# Patient Record
Sex: Female | Born: 1980 | Hispanic: Yes | Marital: Married | Smoking: Never smoker
Health system: Southern US, Community
[De-identification: ages and names within clinical notes are randomized; demographics above are authoritative.]

## PROBLEM LIST (undated history)

## (undated) DIAGNOSIS — Z6841 Body Mass Index (BMI) 40.0 and over, adult: Principal | ICD-10-CM

## (undated) DIAGNOSIS — N911 Secondary amenorrhea: Secondary | ICD-10-CM

## (undated) HISTORY — DX: Body Mass Index (BMI) 40.0 and over, adult: Z684

## (undated) HISTORY — DX: Morbid (severe) obesity due to excess calories: E66.01

## (undated) HISTORY — DX: Secondary amenorrhea: N91.1

---

## 2013-01-31 ENCOUNTER — Encounter (HOSPITAL_COMMUNITY): Payer: Self-pay

## 2013-03-08 ENCOUNTER — Inpatient Hospital Stay (HOSPITAL_COMMUNITY): Admission: AD | Admit: 2013-03-08 | Payer: Self-pay | Source: Ambulatory Visit | Admitting: Obstetrics & Gynecology

## 2013-03-29 ENCOUNTER — Other Ambulatory Visit (HOSPITAL_COMMUNITY): Payer: Self-pay | Admitting: Physician Assistant

## 2013-03-29 DIAGNOSIS — Z3689 Encounter for other specified antenatal screening: Secondary | ICD-10-CM

## 2013-04-02 ENCOUNTER — Ambulatory Visit (HOSPITAL_COMMUNITY)
Admission: RE | Admit: 2013-04-02 | Discharge: 2013-04-02 | Disposition: A | Discharge status: Home / Self Care | Payer: Self-pay | Source: Ambulatory Visit | Attending: Physician Assistant | Admitting: Physician Assistant

## 2013-04-02 DIAGNOSIS — Z3689 Encounter for other specified antenatal screening: Secondary | ICD-10-CM | POA: Insufficient documentation

## 2013-04-09 ENCOUNTER — Other Ambulatory Visit (HOSPITAL_COMMUNITY): Payer: Self-pay | Admitting: Physician Assistant

## 2013-04-09 DIAGNOSIS — Z0489 Encounter for examination and observation for other specified reasons: Secondary | ICD-10-CM

## 2013-04-16 ENCOUNTER — Ambulatory Visit (HOSPITAL_COMMUNITY)
Admission: RE | Admit: 2013-04-16 | Discharge: 2013-04-16 | Disposition: A | Discharge status: Home / Self Care | Payer: Self-pay | Source: Ambulatory Visit | Attending: Physician Assistant | Admitting: Physician Assistant

## 2013-04-16 DIAGNOSIS — Z3689 Encounter for other specified antenatal screening: Secondary | ICD-10-CM | POA: Insufficient documentation

## 2013-04-16 DIAGNOSIS — O99210 Obesity complicating pregnancy, unspecified trimester: Secondary | ICD-10-CM | POA: Insufficient documentation

## 2013-04-16 DIAGNOSIS — E669 Obesity, unspecified: Secondary | ICD-10-CM | POA: Insufficient documentation

## 2013-04-16 DIAGNOSIS — Z0489 Encounter for examination and observation for other specified reasons: Secondary | ICD-10-CM

## 2015-08-06 ENCOUNTER — Ambulatory Visit: Payer: Self-pay

## 2015-08-13 ENCOUNTER — Encounter (HOSPITAL_COMMUNITY): Payer: Self-pay | Admitting: *Deleted

## 2015-08-13 ENCOUNTER — Emergency Department (INDEPENDENT_AMBULATORY_CARE_PROVIDER_SITE_OTHER)
Admission: EM | Admit: 2015-08-13 | Discharge: 2015-08-13 | Disposition: A | Discharge status: Home / Self Care | Payer: Self-pay | Source: Home / Self Care | Attending: Family Medicine | Admitting: Family Medicine

## 2015-08-13 DIAGNOSIS — J069 Acute upper respiratory infection, unspecified: Secondary | ICD-10-CM

## 2015-08-13 MED ORDER — CETIRIZINE HCL 10 MG PO TABS: 10.0000 mg | ORAL_TABLET | Freq: Every day | ORAL | Status: DC

## 2015-08-13 MED ORDER — IPRATROPIUM BROMIDE 0.06 % NA SOLN: 2.0000 | Freq: Four times a day (QID) | NASAL | Status: DC

## 2015-08-13 NOTE — ED Notes (Signed)
Pt  Has  A   sorethroat  X  5  Days        Pt      Is  Sitting  Upright  On  The    exma  Table    Also  Has  Symptoms  Of  Cough  As   Well

## 2015-08-13 NOTE — ED Provider Notes (Signed)
CSN: [id number]     Arrival date & time 08/13/15  1312 History   First MD Initiated Contact with Patient 08/13/15 1415     No chief complaint on file.  (Consider location/radiation/quality/duration/timing/severity/associated sxs/prior Treatment) Patient is a [age] y.o. female presenting with pharyngitis. The history is provided by the patient. The history is limited by a language barrier.  Sore Throat This is a new problem. The current episode started 2 days ago. The problem has not changed since onset.Pertinent negatives include no chest pain and no abdominal pain. The symptoms are aggravated by swallowing.    No past medical history on file. No past surgical history on file. No family history on file. Social History  Substance Use Topics  . Smoking status: Not on file  . Smokeless tobacco: Not on file  . Alcohol Use: Not on file   OB History    No data available     Review of Systems  Constitutional: Negative.   HENT: Positive for congestion, postnasal drip, rhinorrhea and sore throat.   Respiratory: Negative.   Cardiovascular: Negative.  Negative for chest pain.  Gastrointestinal: Negative.  Negative for abdominal pain.  All other systems reviewed and are negative.   Allergies  Review of patient's allergies indicates not on file.  Home Medications   Prior to Admission medications   Medication Sig Start Date End Date Taking? Authorizing Provider  cetirizine (ZYRTEC) 10 MG tablet Take 1 tablet (10 mg total) by mouth daily. 08/13/15   Linna HoffJames D Loda Bialas, MD  ipratropium (ATROVENT) 0.06 % nasal spray Place 2 sprays into both nostrils 4 (four) times daily. 08/13/15   Linna HoffJames D Pryor Guettler, MD   Meds Ordered and Administered this Visit  Medications - No data to display  BP 162/125 mmHg  Pulse 72  Temp(Src) 98.9 F (37.2 C) (Oral)  Resp 18  SpO2 95% No data found.   Physical Exam  Constitutional: She is oriented to person, place, and time. She appears well-developed and  well-nourished. No distress.  HENT:  Head: Normocephalic.  Right Ear: External ear normal.  Left Ear: External ear normal.  Nose: Rhinorrhea present.  Mouth/Throat: Oropharynx is clear and moist.  Neck: Normal range of motion. Neck supple.  Lymphadenopathy:    She has no cervical adenopathy.  Neurological: She is alert and oriented to person, place, and time.  Skin: Skin is warm and dry.  Nursing note and vitals reviewed.   ED Course  Procedures (including critical care time)  Labs Review Labs Reviewed - No data to display  Imaging Review No results found.   Visual Acuity Review  Right Eye Distance:   Left Eye Distance:   Bilateral Distance:    Right Eye Near:   Left Eye Near:    Bilateral Near:         MDM   1. URI (upper respiratory infection)    rx atrovent, zyrtec    Linna HoffJames D Sheniya Garciaperez, MD 08/13/15 1436

## 2015-08-27 ENCOUNTER — Ambulatory Visit: Payer: Self-pay | Admitting: Internal Medicine

## 2015-09-23 ENCOUNTER — Encounter (HOSPITAL_COMMUNITY): Payer: Self-pay | Admitting: *Deleted

## 2015-09-23 ENCOUNTER — Emergency Department (INDEPENDENT_AMBULATORY_CARE_PROVIDER_SITE_OTHER)
Admission: EM | Admit: 2015-09-23 | Discharge: 2015-09-23 | Disposition: A | Discharge status: Home / Self Care | Payer: Self-pay | Source: Home / Self Care

## 2015-09-23 ENCOUNTER — Emergency Department (INDEPENDENT_AMBULATORY_CARE_PROVIDER_SITE_OTHER): Payer: Self-pay

## 2015-09-23 DIAGNOSIS — T1490 Injury, unspecified: Secondary | ICD-10-CM

## 2015-09-23 DIAGNOSIS — T1490XA Injury, unspecified, initial encounter: Secondary | ICD-10-CM

## 2015-09-23 NOTE — ED Provider Notes (Addendum)
CSN: [id number]     Arrival date & time 09/23/15  1304 History   None    No chief complaint on file.  (Consider location/radiation/quality/duration/timing/severity/associated sxs/prior Treatment) The history is provided by the patient. A language interpreter was used.  History obtained from patient:   LOCATION: chest SEVERITY:3 DURATION:last night CONTEXT:exposed to cleaning chemicals QUALITY: MODIFYING FACTORS:went into the fresh air, only exposed about 5 minutes ASSOCIATED SYMPTOMS: nose felt it was burning TIMING:resolving     No past medical history on file. No past surgical history on file. No family history on file. Social History  Substance Use Topics  . Smoking status: Never Smoker   . Smokeless tobacco: Not on file  . Alcohol Use: No   OB History    No data available     Review of Systems ROS +'ve chemical exposure  Denies: HEADACHE, NAUSEA, ABDOMINAL PAIN, CHEST PAIN, CONGESTION, DYSURIA, SHORTNESS OF BREATH  Allergies  Review of patient's allergies indicates no known allergies.  Home Medications   Prior to Admission medications   Medication Sig Start Date End Date Taking? Authorizing Provider  cetirizine (ZYRTEC) 10 MG tablet Take 1 tablet (10 mg total) by mouth daily. 08/13/15   Linna HoffJames D Kindl, MD  ipratropium (ATROVENT) 0.06 % nasal spray Place 2 sprays into both nostrils 4 (four) times daily. 08/13/15   Linna HoffJames D Kindl, MD   Meds Ordered and Administered this Visit  Medications - No data to display  BP 149/97 mmHg  Pulse 110  Temp(Src) 99 F (37.2 C) (Oral)  Resp 20  SpO2 98% No data found.   Physical Exam  Constitutional: She is oriented to person, place, and time. She appears well-developed and well-nourished. No distress.  HENT:  Head: Normocephalic and atraumatic.  Eyes: Conjunctivae are normal.  Neck: Normal range of motion. Neck supple.  Cardiovascular: Normal rate, regular rhythm and normal heart sounds.   Pulmonary/Chest: Effort  normal and breath sounds normal.  Abdominal: Soft.  Musculoskeletal: Normal range of motion.  Neurological: She is alert and oriented to person, place, and time.  Skin: Skin is warm and dry.  Psychiatric: She has a normal mood and affect. Her behavior is normal. Judgment and thought content normal.    ED Course  Procedures (including critical care time)  Labs Review Labs Reviewed - No data to display  Imaging Review Dg Chest 2 View  09/23/2015  CLINICAL DATA:  Cough.  Shortness breath.  Headache for 2 days. EXAM: CHEST - 2 VIEW COMPARISON:  None. FINDINGS: Heart size is normal. The lung volumes are low. No focal airspace disease is present. There is no edema or effusion to suggest failure. The visualized soft tissues and bony thorax are unremarkable. IMPRESSION: 1. Low lung volumes. 2. No acute cardiopulmonary disease. Electronically Signed   By: Marin Robertshristopher  Mattern M.D.   On: 09/23/2015 14:31     Visual Acuity Review  Right Eye Distance:   Left Eye Distance:   Bilateral Distance:    Right Eye Near:   Left Eye Near:    Bilateral Near:       EKG: NSR with normal rate, rhythm, axis and intervals. No acute changes noted.   MDM   1. Inhalation injury     Chest x-ray does not demonstrate any acute pneumonitis. Patient is not dyspneic with exertion. I have reviewed EKG and chest x-ray results with the patient. She is advised that she can safely return to work his lungs is no further chemical exposure. Instructions or  care provided discharged home in stable condition.      Tharon Aquas, PA 09/23/15 1548  Tharon Aquas, [place] 10/19/15 [phone]

## 2015-09-23 NOTE — ED Notes (Signed)
Pt  Reports  Some  Chest pain/  Tightness    When  She  She  Takes  A  Deep  Breath      She reports  She  May  Have    Inhaled  Some  Cleaning  Solution  Last   Pm        She  Seems  Slightly  Anxious       Skin  Is  Warm  And  Dry          Cap  Refill  Is  Brisk

## 2015-09-23 NOTE — Discharge Instructions (Signed)
Su radiografa no muestra ninguna lesin pulmonar.  El ECG es normal en este momento.  Tratamiento sintomtico en Advice workerel hogar, tendr que hacer un seguimiento con su mdico si sus sntomas no mejoran.

## 2015-11-06 ENCOUNTER — Ambulatory Visit: Payer: Self-pay | Admitting: Family Medicine

## 2015-12-03 ENCOUNTER — Encounter: Payer: Self-pay | Admitting: Family Medicine

## 2015-12-03 ENCOUNTER — Ambulatory Visit (INDEPENDENT_AMBULATORY_CARE_PROVIDER_SITE_OTHER): Payer: No Typology Code available for payment source | Admitting: Family Medicine

## 2015-12-03 DIAGNOSIS — Z23 Encounter for immunization: Secondary | ICD-10-CM

## 2015-12-03 DIAGNOSIS — Z789 Other specified health status: Secondary | ICD-10-CM

## 2015-12-03 DIAGNOSIS — N912 Amenorrhea, unspecified: Secondary | ICD-10-CM

## 2015-12-03 DIAGNOSIS — L680 Hirsutism: Secondary | ICD-10-CM

## 2015-12-03 LAB — COMPLETE METABOLIC PANEL WITH GFR
ALBUMIN: 3.8 g/dL (ref 3.6–5.1)
ALK PHOS: 90 U/L (ref 33–115)
ALT: 15 U/L (ref 6–29)
AST: 14 U/L (ref 10–30)
BILIRUBIN TOTAL: 0.4 mg/dL (ref 0.2–1.2)
BUN: 12 mg/dL (ref 7–25)
CALCIUM: 8.6 mg/dL (ref 8.6–10.2)
CO2: 25 mmol/L (ref 20–31)
Chloride: 103 mmol/L (ref 98–110)
Creat: 0.55 mg/dL (ref 0.50–1.10)
GFR, Est African American: >89 mL/min (ref >=60)
GFR, Est Non African American: >89 mL/min (ref >=60)
Glucose, Bld: 83 mg/dL (ref 65–99)
POTASSIUM: 4 mmol/L (ref 3.5–5.3)
Sodium: 137 mmol/L (ref 135–146)
TOTAL PROTEIN: 7.4 g/dL (ref 6.1–8.1)

## 2015-12-03 LAB — POCT URINALYSIS DIP (DEVICE)
BILIRUBIN URINE: NEGATIVE
Glucose, UA: NEGATIVE mg/dL
Hgb urine dipstick: NEGATIVE
KETONES UR: NEGATIVE mg/dL
Leukocytes, UA: NEGATIVE
Nitrite: NEGATIVE
PH: 6 (ref 5.0–8.0)
PROTEIN: NEGATIVE mg/dL
SPECIFIC GRAVITY, URINE: 1.025 (ref 1.005–1.030)
Urobilinogen, UA: 0.2 mg/dL (ref 0.0–1.0)

## 2015-12-03 LAB — CBC WITH DIFFERENTIAL/PLATELET
BASOS PCT: 0 % (ref 0–1)
Basophils Absolute: 0 10*3/uL (ref 0.0–0.1)
EOS ABS: 0.3 10*3/uL (ref 0.0–0.7)
EOS PCT: 3 % (ref 0–5)
HCT: 41.1 % (ref 36.0–46.0)
HEMOGLOBIN: 14 g/dL (ref 12.0–15.0)
Lymphocytes Relative: 23 % (ref 12–46)
Lymphs Abs: 2 10*3/uL (ref 0.7–4.0)
MCH: 29.1 pg (ref 26.0–34.0)
MCHC: 34.1 g/dL (ref 30.0–36.0)
MCV: 85.4 fL (ref 78.0–100.0)
MONO ABS: 0.6 10*3/uL (ref 0.1–1.0)
MPV: 9.7 fL (ref 8.6–12.4)
Monocytes Relative: 7 % (ref 3–12)
NEUTROS ABS: 5.7 10*3/uL (ref 1.7–7.7)
Neutrophils Relative %: 67 % (ref 43–77)
PLATELETS: 262 10*3/uL (ref 150–400)
RBC: 4.81 MIL/uL (ref 3.87–5.11)
RDW: 14.9 % (ref 11.5–15.5)
WBC: 8.5 10*3/uL (ref 4.0–10.5)

## 2015-12-03 LAB — HEMOGLOBIN A1C
Hgb A1c MFr Bld: 5.7 % — ABNORMAL HIGH (ref <5.7)
Mean Plasma Glucose: 117 mg/dL — ABNORMAL HIGH (ref <117)

## 2015-12-03 LAB — POCT URINE PREGNANCY: PREG TEST UR: NEGATIVE

## 2015-12-03 NOTE — Progress Notes (Signed)
Subjective:    Patient ID: Crystal Turner, female    DOB: December 26, 1980, 35 y.o.   MRN: [id number]  HPI Crystal Turner, a 35 year old female presents to establish care. Patient states that she has not been followed by a primary provider since relocating from [place] almost a year ago. She has been using the emergency department and urgent care for all primary needs. She is currently complaining of amenorrhea since September 2016. She states that she was previously on the Depo-provera injection, which was discontinued in January. She has a 3 year old son. She maintains that she has not been sexually active for the past several months. She reports increased hair growth to chin and chest. She denies fever, fatigue, nausea, vomiting, constipation, dysuria, or abdominal pain.  Past Medical History  Diagnosis Date  . Amenorrhea, secondary   . Morbid obesity [facility])    Past Surgical History  Procedure Laterality Date  . Cesarean section    No Known Allergies  Immunization History  Administered Date(s) Administered  . Tdap 12/03/2015   Social History   Social History  . Marital Status: Married    Spouse Name: N/A  . Number of Children: N/A  . Years of Education: N/A   Occupational History  . Not on file.   Social History Main Topics  . Smoking status: Never Smoker   . Smokeless tobacco: Not on file  . Alcohol Use: No  . Drug Use: No  . Sexual Activity: No   Other Topics Concern  . Not on file   Social History Narrative    Review of Systems  Constitutional: Negative.   HENT: Negative.   Eyes: Negative.   Respiratory: Negative for shortness of breath, wheezing and stridor.   Gastrointestinal: Negative.   Endocrine: Positive for polyuria. Negative for cold intolerance, heat intolerance, polydipsia and polyphagia.  Musculoskeletal: Negative.   Allergic/Immunologic: Negative for immunocompromised state.  Hematological: Negative.    Psychiatric/Behavioral: Negative.       Objective:   Physical Exam  Constitutional: She is oriented to person, place, and time.  HENT:  Head: Normocephalic and atraumatic.  Right Ear: External ear normal.  Left Ear: External ear normal.  Mouth/Throat: Oropharynx is clear and moist.  Eyes: Conjunctivae and EOM are normal. Pupils are equal, round, and reactive to light.  Neck: Normal range of motion. Neck supple.  Cardiovascular: Normal rate, regular rhythm, normal heart sounds and intact distal pulses.   Pulmonary/Chest: Effort normal and breath sounds normal.  Abdominal: Soft. Bowel sounds are normal.  Musculoskeletal: Normal range of motion.  Neurological: She is alert and oriented to person, place, and time. She has normal reflexes.  Skin: Skin is warm and dry.  Psychiatric: She has a normal mood and affect. Her behavior is normal. Judgment and thought content normal.     BP 114/64 mmHg  Pulse 63  Temp(Src) 97.8 F (36.6 C) (Oral)  Resp 16  Ht  (1.626 m)  Wt 279 lb (126.554 kg)  BMI 47.87 kg/m2  LMP 06/26/2015 Assessment & Plan:  1. Morbid obesity due to excess calories (HCC) Recommend a lowfat, low carbohydrate diet divided over 5-6 small meals, increase water intake to 6-8 glasses, and 150 minutes per week of cardiovascular exercise. Discussed diet and exercise at length, patient expressed understanding.   - POCT urinalysis dip (device) - TSH - Hemoglobin A1c - COMPLETE METABOLIC PANEL WITH GFR - CBC with Differential - Lipid Panel; Future  2. Amenorrhea I suspect polycystic ovaries due to presentation. Patient has acne primarily to chin and hirsutism. Will check hormone levels. She may warrant a pelvic/transvaginal ultrasound. Will review labs. Urine pregnancy test negative.  - POCT urine pregnancy - TSH - Testosterone  3. 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   4. Need for Tdap vaccination  - Tdap  vaccine greater than or equal to 7yo IM  5. Language barrier to communication Utilized language line to assist with communication   Routine Health Maintenance: Recommend a pap smear in 1 year.  Recommend a lowfat, low carbohydrate diet divided over 5-6 small meals, increase water intake to 6-8 glasses, and 150 minutes per week of cardiovascular exercise.    RTC: Will schedule follow up appointment after reviewing labs.   Massie Maroon, FNP

## 2015-12-03 NOTE — Patient Instructions (Addendum)
Will scheduled a follow up appointment after reviewing  laboratory results.    Recuento de caloras para bajar de peso (Calorie Counting for Edison International Loss) Las caloras son energa que se obtiene de lo que se come y se bebe. El organismo [place] esta energa para mantenerlo Scientist, water quality. La cantidad de caloras que come tiene incidencia Gap Inc. Cuando come ms caloras de las que el cuerpo necesita, este acumula las caloras extra [place]. Cuando come Nationwide Mutual Insurance de las que el cuerpo Dresser, este quema grasa para obtener la energa que requiere. El recuento de caloras es el registro de la cantidad de caloras que come y Investment banker, operational. Si se asegura de comer menos caloras de las que el cuerpo necesita, debe bajar de [place]. Para que el recuento de caloras funcione, tendr que comer la cantidad de caloras adecuadas para usted en un da, para bajar una cantidad de peso saludable por semana. Una cantidad de peso saludable para bajar por semana suele ser [place] 1 y Enedina Finner (0,5 a 0,9kg). Un nutricionista puede determinar la cantidad de caloras que necesita por da y sugerirle cmo alcanzar su objetivo calrico.  CUL ES MI PLAN? Mi objetivo es comer __________ Garry Heater da.  Si como esta cantidad de caloras por da, debo bajar unas __________ Albertine Grates. QU DEBO SABER ACERCA DEL RECUENTO DE CALORAS? A fin de alcanzar su objetivo diario de caloras, tendr que:  Averiguar cuntas caloras hay en cada alimento que le Lobbyist. Intente hacerlo antes de comer.  Decida la cantidad que puede comer del alimento.  Anote lo que comi y cuntas caloras tena. Esta tarea se conoce como llevar un registro de comidas. DNDE ENCUENTRO INFORMACIN SOBRE LAS CALORAS? Es posible Veterinary surgeon cantidad de caloras que contiene un alimento en la etiqueta de informacin nutricional. Tenga en cuenta que toda la informacin que se incluye en una etiqueta se basa en una porcin  especfica del alimento. Si un alimento no tiene una etiqueta de informacin nutricional, intente buscar las caloras en Internet o pida ayuda al nutricionista. CMO DECIDO CUNTO COMER? Para decidir qu cantidad del alimento puede comer, tendr que tener en cuenta el nmero de caloras en una porcin y el tamao de una porcin. Es posible Scientist, water quality en la etiqueta de informacin nutricional. Si un alimento no tiene una etiqueta de informacin nutricional, busque los [place] en Internet o pida ayuda al nutricionista. Recuerde que las caloras se calculan por porcin. Si opta por comer ms de una porcin de un alimento, tendr [employer] Inc las caloras por porcin por la cantidad de porciones que planea comer. Por ejemplo, la etiqueta de un envase de pan puede decir que el tamao de una porcin es [place], y que una porcin tiene 90caloras. Si come 1rodaja, habr comido 90caloras. Si come 2rodajas, habr comido 180caloras. CMO LLEVO UN REGISTRO DE COMIDAS? Despus de cada comida, registre la siguiente informacin en el registro de comidas:  Lo que comi.  La cantidad que comi.  La cantidad de caloras que tena.  Luego, sume las caloras. Tenga a Scientific laboratory technician de comidas, por ejemplo, en un anotador de bolsillo. Otra alternativa es usar una aplicacin en el telfono mvil o un sitio web. Algunos programas calcularn las caloras y [employer] Corporation la cantidad de caloras que le [place], [place] vez que agregue un alimento al Engineer, maintenance (IT). CULES SON ALGUNOS CONSEJOS PARA EL RECUENTO DE CALORAS?  Use las caloras de los alimentos y  las bebidas que lo sacien y no lo dejen con apetito, por ejemplo, frutos secos y Civil engineer, contracting de frutos secos, verduras, protenas magras y alimentos con alto contenido de fibra (ms de 5g de fibra por porcin).  Coma alimentos nutritivos y evite las caloras vacas. Las caloras vacas son aquellas que se obtienen de los alimentos o las bebidas que no  contienen muchos nutrientes, como los dulces y los refrescos. Es mejor comer una comida nutritiva altamente calrica (como un aguacate) que una con pocos nutrientes (como una bolsa de patatas fritas).  Sepa cuntas caloras tienen los alimentos que come con ms frecuencia. Liberty Global, no tiene que buscar este dato cada vez que los come.  Est atento a los International aid/development worker hipocalricos, pero que en realidad contienen muchas caloras, como los productos de [place], los refrescos y los dulces sin Holiday representative.  Preste atencin a las Limited Brands, Lubrizol Corporation refrescos, las bebidas a base de [place], las bebidas con alcohol y los jugos, que contienen muchas caloras, pero no le dan saciedad. Opte por las bebidas bajas en caloras, como el agua y las [address] dietticas.  Concntrese en tratar de contar las caloras de los alimentos que tienen la mayor cantidad de caloras. Registrar las caloras de una ensalada que solo contiene hortalizas es menos importante que calcular las de un batido de [place].  Encuentre un mtodo para controlar las caloras que funcione para usted. Sea creativo. La Harley-Davidson de las personas que alcanzan el xito encuentran mtodos para llevar un registro de cunto comen en un da, incluso si no cuentan cada calora. CULES SON ALGUNOS CONSEJOS PARA CONTROLAR LAS PORCIONES?  Sepa cuntas caloras hay en una porcin. Esto lo ayudar a saber cuntas porciones de un alimento determinado puede comer.  Use una taza medidora para medir los [employer], lo que es muy til al principio. Con el tiempo, podr hacer un clculo estimativo de los tamaos de las porciones de algunos alimentos.  Dedique tiempo a poner porciones de diferentes alimentos en sus platos, tazones y tazas predilectos, a fin de saber cmo se ve una porcin.  Intente no comer directamente de [place] bolsa o una caja, ya que esto puede llevarlo a comer en exceso. Ponga la cantidad [employer] gustara  comer en una taza o un plato, a fin de asegurarse de que est comiendo la porcin correcta.  Use platos, vasos y tazones ms pequeos para no comer en exceso. Esta es una forma rpida y sencilla de poner en prctica el control de las porciones. Si el plato es ms pequeo, le caben menos alimentos.  Intente no hacer muchas tareas mientras come, como ver televisin o usar la computadora. Si es la hora de comer, sintese a Museum/gallery conservator y disfrute de Chemical engineer. Esto lo ayudar a que empiece a Public house manager cundo est satisfecho. Tambin le permitir estar ms consciente de lo que come y cunto est comiendo. CMO PUEDO HACER EL RECUENTO DE CALORAS CUANDO COMO AFUERA?  Pida porciones ms pequeas o porciones para nios.  Considere la posibilidad de Agricultural consultant un plato principal y las guarniciones, en lugar de pedir su propio plato principal.  Si pide su propio plato principal, coma solo la mitad. Pida una caja al comienzo de la comida y ponga all el resto del plato principal, para no sentir la tentacin de comerlo.  Busque las caloras en el men. Si se detallan las caloras, elija las opciones que contengan la menor cantidad.  Elija platos que incluyan verduras, frutas, cereales integrales, productos lcteos con bajo contenido de grasa y Associate Professor. Centrarse en elegir con inteligencia alimentos de cada uno de los 5grupos que puede ayudarlo a seguir por el buen camino en los restaurantes.  Opte por los alimentos hervidos, asados, cocidos a la parrilla o al vapor.  Elija el agua, la [place], [place] t helado sin azcar u otras bebidas que no contengan azcares agregados. Si desea una bebida alcohlica, escoja una opcin con menos caloras. Por ejemplo, un margarita normal puede [employer], y un vaso de vino tiene unas 150.  No coma alimentos que contengan mantequilla, estn empanados, fritos o que se sirvan con salsa a base de crema. Generalmente, los alimentos que se etiquetan como  "crujientes" estn fritos, a menos que se indique lo contrario.  Ordene los Pathmark Stores, las salsas y los jarabes aparte, ya que suelen tener muchas caloras; por lo tanto, no los consuma en grandes cantidades.  Tenga cuidado con las [place]. Muchas personas piensan que las ensaladas son Neomia Dear opcin saludable, pero en muchas cosas, esto no es as. Hay muchas ensaladas que contienen tocino, pollo frito, grandes cantidades de [place], patatas fritas y Print production planner. Todos estos productos son altamente calricos. Si desea Canary Brim, elija una de hortalizas y pida carnes a la parrilla o un filete. Ordene el aderezo aparte o pida aceite de [place] y vinagre o limn para Emergency planning/management officer.  Haga un clculo estimativo de la cantidad de porciones que le sirven. Por ejemplo, una porcin de arroz cocido equivale a media taza o tiene el tamao aproximado de un molde de [place], o de media pelota de tenis. Conocer el tamao de las porciones lo ayudar a Theme park manager atento a la cantidad de comida que come Pitney Bowes. La lista que sigue le [place] el tamao de algunas porciones comunes a partir de objetos cotidianos.  1onza (28g) = 4dados apilados.  3onzas (85g) = de cartas.  1cucharadita = 1dado.  1cucharada = media pelota de tenis de mesa.  2cucharadas = 1pelota de tenis de mesa.  Media taza = 1pelota de tenis o de magdalena.  1taza = 1 pelota de bisbol.   Esta informacin no tiene Theme park manager el consejo del mdico. Asegrese de hacerle al mdico cualquier pregunta que tenga.   Document Released: 01/13/2009 Document Revised: 10/18/2014 Elsevier Interactive Patient Education 2016 Elsevier Inc. Dieta para el sndrome metablico (Diet for Metabolic Syndrome) El sndrome metablico es un trastorno que incluye al menos tres de estas afecciones:  Obesidad abdominal.  Manson Allan de azcar en la sangre.  Hipertensin arterial.  Una cantidad ms elevada de lo normal de grasa  (lpidos) en la sangre.  Un nivel por debajo de lo normal de colesterol "bueno" (HDL). Seguir una dieta saludable puede ayudar a mantener el sndrome metablico bajo control. Tambin puede ayudar a IT sales professional de afecciones asociadas con este sndrome, como diabetes, cardiopata coronaria e ictus. Junto con el ejercicio, una dieta saludable:  Ayuda a mejorar la forma en que el organismo [place] la [place].  Promueve la prdida de [place]. Un objetivo comn de las personas con esta afeccin es Publishing copy por lo menos entre el siete y el diez por ciento del peso inicial. QU DEBO SABER ACERCA DE ESTA DIETA?  Use el ndice glucmico (IG) para planificar las comidas. El ndice le informa con qu rapidez un alimento elevar su nivel de azcar en la sangre. Elija alimentos con bajos valores de ndice glucmico. Estos  tardan ms tiempo en subir el nivel de azcar en la sangre.  Lleve un registro de la cantidad de caloras que ingiere. La ingesta de la cantidad correcta de caloras lo ayudar a Barista un peso saludable.  Tal vez deba seguir Clinical cytogeneticist. Esta dieta incluye gran cantidad de verduras, carnes magras de pescado, cereales integrales, frutas, as como aceites y grasas saludables. QU ALIMENTOS PUEDO COMER? Cereales [place] molido. Pan integral de centeno. Pan, galletitas, tortillas, cereales y pastas integrales. Avena sin azcar.Trigo burgol.Cebada.Quinua.Arroz integral y arroz salvaje. Hoover Brunette Deatra James. Espinaca. Guisantes. Remolachas. Coliflor. Repollo. Brcoli. Zanahorias. Tomates. Calabaza. Christella Noa. Hierbas. Pimienta. Cebollas. Pepinos. Repollitos de Bruselas. Batatas. ames. Frijoles. Lentejas. Frutas Frutos rojos. Manzanas. Naranjas. Uvas. Mango. Granada. Kiwi. Cerezas. Carnes y otras fuentes de protenas Frutos de mar y Oceanographer. Carnes magras.Aves. Tofu. Lcteos Productos lcteos sin grasa o con bajo contenido de [place], [place], yogur y Ewa Gentry. [employer]S Inc.  Leche con bajo contenido de [place]. Productos alternativos de la [place], [address] de soja o de [place]. Jugo de frutas naturales. Condimentos [place] con bajo contenido de International aid/development worker o sin azcar, salsa barbacoa y [place]. Mostaza. Salsa de pepinillos. Grasas y Arts development officer. Aceite de canola o de oliva. Nueces y [place] de frutos secos.Semillas. Los artculos mencionados arriba pueden no ser Raytheon de las bebidas o los alimentos recomendados. Comunquese con el nutricionista para conocer ms opciones. QU ALIMENTOS NO SE RECOMIENDAN? Carne roja. Aceite de palma y de coco. Alimentos procesados. Comidas fritas. Alcohol. Bebidas azucaradas, como t helado y gaseosas. Dulces. Alimentos salados. Los artculos mencionados arriba pueden no ser Raytheon de las bebidas y los alimentos que se Theatre stage manager. Comunquese con el nutricionista para obtener ms informacin.   Esta informacin no tiene Theme park manager el consejo del mdico. Asegrese de hacerle al mdico cualquier pregunta que tenga.   Document Released: 02/11/2015 Elsevier Interactive Patient Education 2016 [employer]. Dieta restringida en grasas y colesterol (Fat and Cholesterol Restricted Diet) El exceso de grasas y colesterol en la dieta puede causar problemas de [place]. Esta dieta lo ayudar a Pharmacologist las grasas y Print production planner en los niveles normales para evitar enfermarse. QU TIPOS DE GRASAS DEBO ELEGIR?  Elija grasas monosaturadas y polinsaturadas. Estas se encuentran en alimentos como el aceite de oliva, aceite de canola, semillas de lino, nueces, almendras y semillas.  Consuma ms grasas omega-3. Las mejores opciones incluyen salmn, caballa, sardinas, atn, aceite de lino y semillas de lino molidas.  Limite el consumo de grasas saturadas, que se encuentran en productos de origen animal, como carnes, mantequilla y crema. Tambin pueden estar en productos vegetales, como aceite de palma, de  palmiste y de coco.   Evite los alimentos con aceites parcialmente hidrogenados. Estos contienen grasas trans. Entre los ejemplos de alimentos con grasas trans se incluyen margarinas en barra, algunas margarinas untables, galletas dulces o saladas y otros productos horneados. QU PAUTAS GENERALES DEBO SEGUIR?   Lea las etiquetas de los alimentos. Busque las palabras "grasas trans" y "grasas saturadas".  Al preparar una comida:  Llene la mitad del plato con verduras y ensaladas de hojas verdes.  Llene un cuarto del plato con cereales integrales. Busque la palabra "integral" en Estate agent de la lista de ingredientes.  Llene un cuarto del plato con alimentos con protenas magras.  Limite las frutas a dos porciones por da. Elija frutas en lugar de jugos.  Coma ms alimentos con fibra soluble, por ejemplo, manzanas, brcoli, zanahorias, frijoles, guisantes y  cebada. Trate de consumir de 20a 30g (gramos) de Firefighter.  Coma ms comidas caseras. Coma menos en los restaurantes y los bares.  Limite o evite el alcohol.  Limite los alimentos con alto contenido de almidn y International aid/development worker.  Limite el consumo de alimentos fritos.  Cocine los alimentos sin frerlos. Las opciones de coccin ms [place] son Development worker, community, Regulatory affairs officer, Software engineer y asar a Patent attorney.  Baje de peso si es necesario. Aunque pierda [employer], esto puede ser importante para la salud general. Tambin puede ayudar a prevenir enfermedades como diabetes y enfermedad cardaca. QU ALIMENTOS PUEDO COMER? Cereales Cereales integrales, como los panes de salvado o [place], las [place], los cereales y las pastas. Avena sin endulzar, trigo, [place], quinua o arroz integral. Tortillas de harina de maz o de salvado. Verduras Verduras frescas o congeladas (crudas, al vapor, asadas o grilladas). Ensaladas de hojas verdes. Nils Pyle Nils Pyle frescas, en conserva (en su jugo natural) o frutas congeladas. Carnes y otros productos con  protenas Carne de res molida (al 85% o ms [place]), carne de res de animales alimentados con pastos o carne de res sin la grasa. Pollo o pavo sin piel. Carne de pollo o de Padre Ranchitos. Cerdo sin la grasa. Todos los pescados y frutos de mar. Huevos. Porotos, guisantes o lentejas secos. Frutos secos o semillas sin sal. Frijoles secos o en lata sin sal. Lcteos Productos lcteos con bajo contenido de grasas, como Effie o al 1%, quesos reducidos en grasas o al 2%, ricota con bajo contenido de grasas o [employer], o yogur natural con bajo contenido de [place]. Grasas y Hershey Company untables que no contengan grasas trans. [place] y condimentos para ensaladas livianos o reducidos en grasas. [place]. Aceites de oliva, canola, ssamo o crtamo. Mantequilla natural de cacahuate o almendra (elija la que no tenga agregado de aceite o azcar). Los artculos mencionados arriba pueden no ser Raytheon de las bebidas o los alimentos recomendados. Comunquese con el nutricionista para conocer ms opciones. QU ALIMENTOS NO SE RECOMIENDAN? Cereales Pan blanco. Pastas blancas. Arroz blanco. Pan de maz. Bagels, pasteles y croissants. Galletas saladas que contengan grasas trans. Verduras Papas blancas. MazHoover Brunette con crema o fritas. Verduras en salsa de [place]. Nils Pyle Frutas secas. Fruta enlatada en almbar liviano o espeso. Jugo de frutas. Carnes y otros productos con protenas [place] de carne con Holiday representative. Costillas, alas de pollo, tocineta, salchicha, mortadela, salame, chinchulines, tocino, perros calientes, salchichas alemanas y embutidos envasados. Hgado y otros rganos. Lcteos Leche entera o al 2%, crema, mezcla de [place] y crema, y queso crema. Quesos enteros. Yogur entero o endulzado. Quesos con toda su grasa. Cremas no lcteas y coberturas batidas. Quesos procesados, quesos para untar o cuajadas. Dulces y postres Jarabe de maz, azcares, miel y Radio broadcast assistant. Caramelos.  Mermelada y [place]. Doreen Beam. Cereales endulzados. Galletas, pasteles, bizcochuelos, donas, muffins y helado. Grasas y [address], [place] en barra, [place] de [place], [place], [place] clarificada o grasa de tocino. Aceites de coco, de palmiste o de palma. Bebidas Alcohol. Bebidas endulzadas (como refrescos, limonadas y bebidas frutales o ponches). Los artculos mencionados arriba pueden no ser Raytheon de las bebidas y los alimentos que se Theatre stage manager. Comunquese con el nutricionista para obtener ms informacin.   Esta informacin no tiene Theme park manager el consejo del mdico. Asegrese de hacerle al mdico cualquier pregunta que tenga.   Document Released: 09/27/2005 Document Revised: 10/18/2014 Elsevier Interactive Patient Education 2016 Elsevier Inc. Ramond Dial secundaria  (Secondary Amenorrhea)  La amenorrea secundaria es la detencin del flujo menstrual durante 3-6 meses en una mujer que previamente tena sus perodos. Hay muchas causas posibles: La mayora de las causas no son graves. Generalmente, al tratar el problema subyacente que causa la detencin de la [place], podr volver a tener sus perodos normales. CAUSAS  Algunas causas comunes de la falta de menstruacin son:  Desnutricin.  Bajo nivel de Banker (hipoglucemia).  Enfermedad poliqustica de los ovarios.  Estrs o miedos.  Aletta Edouard.  Desequilibrio hormonal.  Insuficiencia ovrica.  Medicamentos.  Obesidad extrema.  Fibrosis qustica.  Reduccin de peso drstica por cualquier causa.  Menopausia precoz.  Extirpacin de los ovarios o del tero.  Anticonceptivos.  Enfermedades.  Enfermedades de larga duracin (crnicas).  Sndrome de [place].  Problemas de tiroides.  Pldoras, parches o anillos vaginales para el control de la natalidad. FACTORES DE RIESGO Puede tener ms riesgo de amenorrea secundaria si:  Tiene una historia familiar de este  problema.  Sufre un trastorno alimentario.  Realiza entrenamiento deportivo. DIAGNSTICO  Este diagnstico la realiza el mdico por medio de la historia clnica y el examen fsico. Incluir un examen plvico para verificar si hay problemas en los rganos reproductores. Debe descartarse la posibilidad de embarazo. Generalmente se indicarn diferentes anlisis de sangre para medir diferentes tipos de hormonas en el organismo. Le indicarn anlisis de [place]. Le harn algunos estudios especializados (ecografas, tomografa computada, resonancia magntica o histeroscopa) y tambin medirn su ndice de masa corporal [facility]). TRATAMIENTO  El tratamiento depende de la causa de la [place]. Si hay un trastorno de Psychologist, sport and exercise, deber tratarse con la dieta y la terapia [place]. Los trastornos crnicos pueden mejorar con el tratamiento de la enfermedad. La amenorrea puede corregirse con medicamentos, cambios en el estilo de vida o con [place]. Si la amenorrea no puede corregirse, algunas veces es posible crear una falsa menstruacin con medicamentos. INSTRUCCIONES PARA EL CUIDADO EN EL HOGAR  Consuma una dieta saludable.  Controle los problemas de [place].  Haga ejercicios con regularidad, pero no excesivamente.  Duerma lo suficiente.  Controle el estrs.  Observe si hay cambios en el ciclo menstrual. Mantenga un registro del momento en que ocurren los perodos. Anote la fecha de inicio de los perodos, cunto duran y si hay problemas. SOLICITE ATENCIN MDICA SI: Los sntomas no mejoran con Scientist, research (medical).   Esta informacin no tiene Theme park manager el consejo del mdico. Asegrese de hacerle al mdico cualquier pregunta que tenga.   Document Released: 05/30/2013 Document Revised: 10/18/2014 Elsevier Interactive Patient Education [employer]

## 2015-12-04 LAB — TSH: TSH: 1.18 m[IU]/L

## 2015-12-04 LAB — TESTOSTERONE: TESTOSTERONE: 74 ng/dL

## 2016-06-02 ENCOUNTER — Ambulatory Visit: Payer: No Typology Code available for payment source | Admitting: Family Medicine

## 2016-06-21 ENCOUNTER — Ambulatory Visit (HOSPITAL_COMMUNITY)
Admission: EM | Admit: 2016-06-21 | Discharge: 2016-06-21 | Disposition: A | Discharge status: Home / Self Care | Payer: No Typology Code available for payment source

## 2016-06-21 ENCOUNTER — Encounter (HOSPITAL_COMMUNITY): Payer: Self-pay | Admitting: Family Medicine

## 2016-06-21 DIAGNOSIS — H6091 Unspecified otitis externa, right ear: Secondary | ICD-10-CM

## 2016-06-21 MED ORDER — NEOMYCIN-POLYMYXIN-HC 3.5-10000-1 OT SUSP
OTIC | Status: AC
  Filled 2016-06-21: qty 10

## 2016-06-21 NOTE — ED Provider Notes (Signed)
[facility]    CSN: [id number] Arrival date & time: 06/21/16  1646  First Provider Contact:  First MD Initiated Contact with Patient 06/21/16 1846        History   Chief Complaint No chief complaint on file.   HPI Crystal Turner is a 35 y.o. female.    Ear Drainage  This is a new problem. The current episode started 2 days ago. The problem has not changed (dehies uri sx , no left ear sx.) since onset.Associated symptoms comments: Pain and drainage to right ear.    Past Medical History:  Diagnosis Date  . Amenorrhea, secondary   . Morbid obesity (HCC)     There are no active problems to display for this patient.   Past Surgical History:  Procedure Laterality Date  . CESAREAN SECTION      OB History    No data available       Home Medications    Prior to Admission medications   Medication Sig Start Date End Date Taking? Authorizing Provider  cetirizine (ZYRTEC) 10 MG tablet Take 1 tablet (10 mg total) by mouth daily. 08/13/15   Linna HoffJames D Annalyce Lanpher, MD  ipratropium (ATROVENT) 0.06 % nasal spray Place 2 sprays into both nostrils 4 (four) times daily. 08/13/15   Linna HoffJames D Jazelle Achey, MD    Family History No family history on file.  Social History Social History  Substance Use Topics  . Smoking status: Never Smoker  . Smokeless tobacco: Not on file  . Alcohol use No     Allergies   Review of patient's allergies indicates no known allergies.   Review of Systems Review of Systems  Constitutional: Negative.   HENT: Positive for ear discharge and ear pain. Negative for congestion, facial swelling, postnasal drip and rhinorrhea.   All other systems reviewed and are negative.    Physical Exam Triage Vital Signs ED Triage Vitals [06/21/16 1832]  Enc Vitals Group     BP 122/75     Pulse Rate 68     Resp 12     Temp 97.5 F (36.4 C)     Temp Source Oral     SpO2 100 %     Weight      Height      Head Circumference      Peak Flow      Pain Score      Pain Loc      Pain Edu?      Excl. in GC?    No data found.   Updated Vital Signs BP 122/75 (BP Location: Right Arm)   Pulse 68   Temp 97.5 F (36.4 C) (Oral)   Resp 12   SpO2 100%   Visual Acuity Right Eye Distance:   Left Eye Distance:   Bilateral Distance:    Right Eye Near:   Left Eye Near:    Bilateral Near:     Physical Exam  Constitutional: She appears well-developed and well-nourished. No distress.  HENT:  Right Ear: There is drainage and swelling.  Left Ear: External ear normal.  Ears:  Mouth/Throat: Oropharynx is clear and moist.  Nursing note and vitals reviewed.    UC Treatments / Results  Labs (all labs ordered are listed, but only abnormal results are displayed) Labs Reviewed - No data to display  EKG  EKG Interpretation None       Radiology No results found.  Procedures Procedures (including critical care time)  Medications Ordered in UC Medications - No data to display   Initial Impression / Assessment and Plan / UC Course  I have reviewed the triage vital signs and the nursing notes.  Pertinent labs & imaging results that were available during my care of the patient were reviewed by me and considered in my medical decision making (see chart for details).  Clinical Course      Final Clinical Impressions(s) / UC Diagnoses   Final diagnoses:  None    New Prescriptions New Prescriptions   No medications on file     Linna Hoff, MD 06/21/16 2034

## 2016-06-21 NOTE — Discharge Instructions (Signed)
Use 4 drops in ear 3 times a day, return on thurs for wick removal.

## 2016-06-21 NOTE — ED Triage Notes (Signed)
Pt here for right ear pain and drainage x 2 days.

## 2016-06-25 ENCOUNTER — Ambulatory Visit (HOSPITAL_COMMUNITY)
Admission: EM | Admit: 2016-06-25 | Discharge: 2016-06-25 | Disposition: A | Discharge status: Home / Self Care | Payer: No Typology Code available for payment source | Attending: Family Medicine | Admitting: Family Medicine

## 2016-06-25 ENCOUNTER — Encounter (HOSPITAL_COMMUNITY): Payer: Self-pay | Admitting: Emergency Medicine

## 2016-06-25 DIAGNOSIS — Z09 Encounter for follow-up examination after completed treatment for conditions other than malignant neoplasm: Secondary | ICD-10-CM

## 2016-06-25 DIAGNOSIS — T161XXD Foreign body in right ear, subsequent encounter: Secondary | ICD-10-CM

## 2016-06-25 NOTE — ED Triage Notes (Signed)
Pt is here for a f/u and to have ear wick removed from right ear  Reports she's feeling better and voices no new concerns  A&O x4... NAD

## 2016-06-25 NOTE — ED Provider Notes (Cosign Needed)
Dictation #1 [id number]  [id number] CSN: [id number]     Arrival date & time 06/25/16  1353 History   First MD Initiated Contact with Patient 06/25/16 1431     Chief Complaint  Patient presents with  . Follow-up   (Consider location/radiation/quality/duration/timing/severity/associated sxs/prior Treatment) HPI Crystal Turner is a 35 y.o. female presenting to UC with c/o mild Right ear irritation, requesting ear wick to be removed, which was placed on 06/21/16 after pt was see here for Right acute otitis externa. Pt states pain has significantly improved.  Mild itching and soreness present. She is still using the antibiotic ear drops prescribed earlier this week. No other questions or concerns.    Past Medical History:  Diagnosis Date  . Amenorrhea, secondary   . Morbid obesity (HCC)    Past Surgical History:  Procedure Laterality Date  . CESAREAN SECTION     History reviewed. No pertinent family history. Social History  Substance Use Topics  . Smoking status: Never Smoker  . Smokeless tobacco: Never Used  . Alcohol use No   OB History    No data available     Review of Systems  Constitutional: Negative for chills and fever.  HENT: Positive for ear pain ( Right- mild). Negative for congestion, ear discharge, facial swelling, rhinorrhea, sinus pressure and sore throat.     Allergies  Review of patient's allergies indicates no known allergies.  Home Medications   Prior to Admission medications   Medication Sig Start Date End Date Taking? Authorizing Provider  cetirizine (ZYRTEC) 10 MG tablet Take 1 tablet (10 mg total) by mouth daily. 08/13/15   Linna Hoff, MD  ipratropium (ATROVENT) 0.06 % nasal spray Place 2 sprays into both nostrils 4 (four) times daily. 08/13/15   Linna Hoff, MD   Meds Ordered and Administered this Visit  Medications - No data to display  BP 121/75 (BP Location: Left Arm)   Pulse 60   Temp 97.6 F (36.4 C) (Oral)   Resp  16   SpO2 100%  No data found.   Physical Exam  Constitutional: She is oriented to person, place, and time. She appears well-developed and well-nourished.  HENT:  Head: Normocephalic and atraumatic.  Right Ear: There is drainage ( minimal clear-yellow). No swelling or tenderness. A foreign body (ear wick) is present. Tympanic membrane is not injected, not erythematous and not bulging. No middle ear effusion.  Nose: Nose normal.  Mouth/Throat: Uvula is midline, oropharynx is clear and moist and mucous membranes are normal.  Right ear- ear wick in ear, minimal discharge and edema of ear canal. No tenderness on exam.   Eyes: EOM are normal.  Neck: Normal range of motion.  Cardiovascular: Normal rate.   Pulmonary/Chest: Effort normal.  Musculoskeletal: Normal range of motion.  Neurological: She is alert and oriented to person, place, and time.  Skin: Skin is warm and dry.  Psychiatric: She has a normal mood and affect. Her behavior is normal.  Nursing note and vitals reviewed.   Urgent Care Course   Clinical Course    Procedures (including critical care time)  Labs Review Labs Reviewed - No data to display  Imaging Review No results found.   MDM   1. Follow up   2. Foreign body in ear, right, subsequent encounter    Pt presenting for f/u requesting ear wick be removed from Right ear, was placed on 9/11 for treatment of Right otitis externa. Symptoms have improved significantly.  Ear wick removed w/o difficulty.  Encouraged to keep using drops as prescribed.  Patient verbalized understanding and agreement with treatment plan.     Junius Finnerrin O'Malley, PA-C 06/25/16 1512

## 2016-08-13 LAB — GLUCOSE, POCT (MANUAL RESULT ENTRY): POC GLUCOSE: 101 mg/dL — AB (ref 70–99)

## 2016-09-16 IMAGING — DX DG CHEST 2V
2 series · 2 of 2 positions shown · non-contrast
Comparison: None.

CLINICAL DATA: Cough.  Shortness breath.  Headache for 2 days.

EXAM:
CHEST - 2 VIEW

[chest pa]
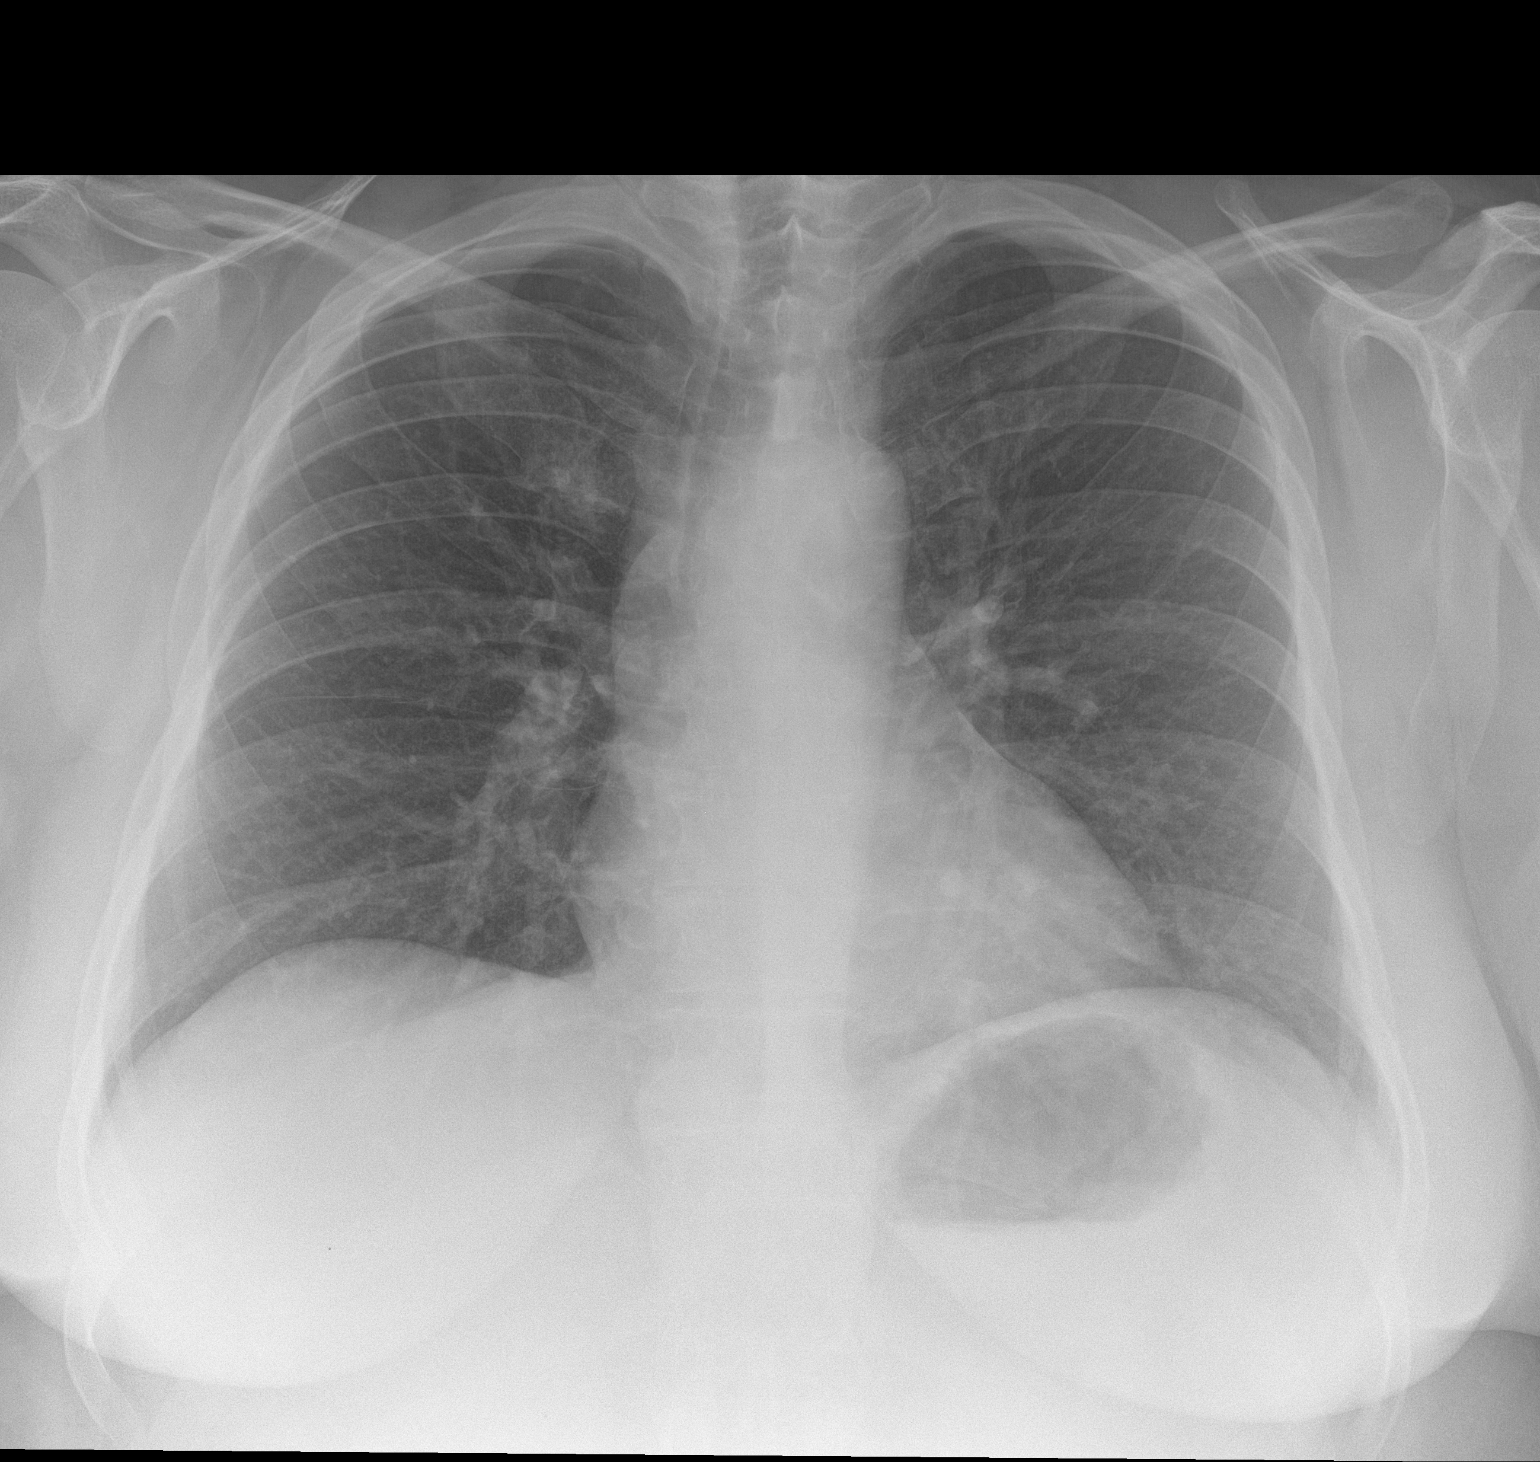

[chest lat]
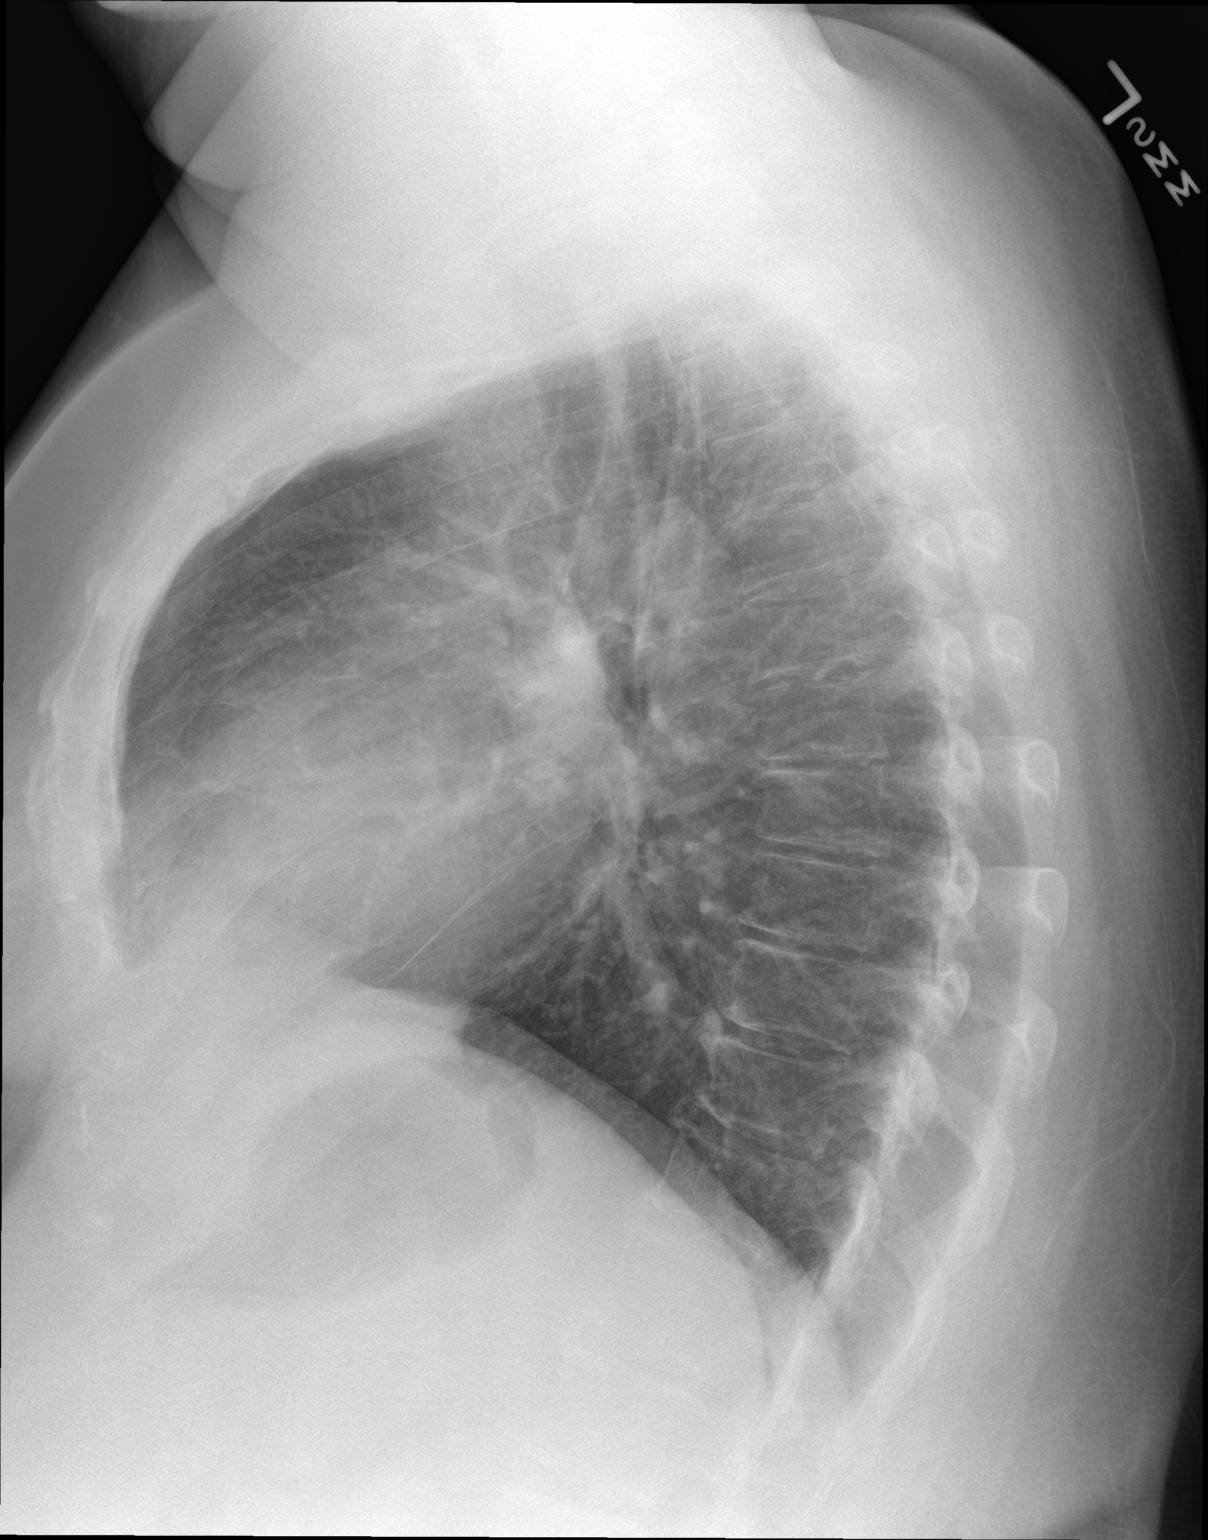

[2 of 2 positions shown; findings below may reference images not displayed]

FINDINGS: Heart size is normal. The lung volumes are low. No focal airspace
disease is present. There is no edema or effusion to suggest
failure. The visualized soft tissues and bony thorax are
unremarkable.
IMPRESSION: 1. Low lung volumes.
2. No acute cardiopulmonary disease.

## 2016-09-17 ENCOUNTER — Ambulatory Visit (INDEPENDENT_AMBULATORY_CARE_PROVIDER_SITE_OTHER): Payer: No Typology Code available for payment source | Admitting: Family Medicine

## 2016-09-17 ENCOUNTER — Encounter: Payer: Self-pay | Admitting: Family Medicine

## 2016-09-17 VITALS — BP 125/69 | HR 67 | Temp 98.2°F | Resp 18 | Ht 62.0 in | Wt 260.0 lb

## 2016-09-17 DIAGNOSIS — J309 Allergic rhinitis, unspecified: Secondary | ICD-10-CM | POA: Insufficient documentation

## 2016-09-17 DIAGNOSIS — Z789 Other specified health status: Secondary | ICD-10-CM

## 2016-09-17 DIAGNOSIS — Z87898 Personal history of other specified conditions: Secondary | ICD-10-CM

## 2016-09-17 LAB — POCT URINALYSIS DIP (DEVICE)
Bilirubin Urine: NEGATIVE
Glucose, UA: NEGATIVE mg/dL
KETONES UR: NEGATIVE mg/dL
Leukocytes, UA: NEGATIVE
Nitrite: NEGATIVE
PH: 6.5 (ref 5.0–8.0)
PROTEIN: 30 mg/dL — AB
SPECIFIC GRAVITY, URINE: 1.025 (ref 1.005–1.030)
Urobilinogen, UA: 0.2 mg/dL (ref 0.0–1.0)

## 2016-09-17 LAB — POCT GLYCOSYLATED HEMOGLOBIN (HGB A1C): Hemoglobin A1C: 5.1

## 2016-09-17 NOTE — Progress Notes (Signed)
Patient is here for FU  Patient denies pain at this time.  Patient has not taken medication today. Patient has eaten today.   

## 2016-09-17 NOTE — Progress Notes (Signed)
Subjective:    Patient ID: Grisela de La Torre Pichardo, femaleBarbara Cower    DOB: 09/27/1981, [age] y.o.   MRN: [id number]  HPI Ms. Mary SellaLiliana de La Torre Pichrdo, a 35 year old female presents for a follow up of prediabetes and obesity. She has not been exercising, drinking water, or following a low carbohydrate diet.  Patient denies foot ulcerations, increase appetite, nausea, paresthesia of the feet, polydipsia, polyuria, visual disturbances, vomitting and weight loss.  Evaluation to date has been included: hemoglobin A1C.   She reports increased hair growth to chin and chest. She denies fever, fatigue, nausea, vomiting, constipation, dysuria, or abdominal pain.  Past Medical History:  Diagnosis Date  . Amenorrhea, secondary   . Morbid obesity (HCC)    Past Surgical History:  Procedure Laterality Date  . CESAREAN SECTION    No Known Allergies  Immunization History  Administered Date(s) Administered  . Influenza,inj,Quad PF,36+ Mos 08/25/2016  . Tdap 12/03/2015   Social History   Social History  . Marital status: Married    Spouse name: N/A  . Number of children: N/A  . Years of education: N/A   Occupational History  . Not on file.   Social History Main Topics  . Smoking status: Never Smoker  . Smokeless tobacco: Never Used  . Alcohol use No  . Drug use: No  . Sexual activity: No   Other Topics Concern  . Not on file   Social History Narrative  . No narrative on file    Review of Systems  Constitutional: Positive for appetite change and unexpected weight change (weight gain).  HENT: Negative.   Eyes: Negative.   Respiratory: Negative for shortness of breath, wheezing and stridor.   Gastrointestinal: Negative.   Endocrine: Negative for cold intolerance, heat intolerance, polydipsia, polyphagia and polyuria.  Musculoskeletal: Negative.   Allergic/Immunologic: Negative for immunocompromised state.  Hematological: Negative.   Psychiatric/Behavioral: Negative.        Objective:   Physical Exam  Constitutional: She is oriented to person, place, and time.  Morbid obesity  HENT:  Head: Normocephalic and atraumatic.  Right Ear: External ear normal.  Left Ear: External ear normal.  Mouth/Throat: Oropharynx is clear and moist.  Eyes: Conjunctivae and EOM are normal. Pupils are equal, round, and reactive to light.  Neck: Normal range of motion. Neck supple.  Cardiovascular: Normal rate, regular rhythm, normal heart sounds and intact distal pulses.   Pulmonary/Chest: Effort normal and breath sounds normal.  Abdominal: Soft. Bowel sounds are normal.  Musculoskeletal: Normal range of motion.  Neurological: She is alert and oriented to person, place, and time. She has normal reflexes.  Skin: Skin is warm and dry.  Psychiatric: She has a normal mood and affect. Her behavior is normal. Judgment and thought content normal.     BP 125/69 (BP Location: Left Arm, Patient Position: Sitting, Cuff Size: Normal)   Pulse 67   Temp 98.2 F (36.8 C) (Oral)   Resp 18   Ht 5\' 2"  (1.575 m)   Wt 260 lb (117.9 kg)   SpO2 100%   BMI 47.55 kg/m  Assessment & Plan:   1. History of prediabetes Previous hemoglobin a1C 5.7, has improved to 5.0. The patient is asked to make an attempt to improve diet and exercise patterns to aid in medical management of this problem.    2. Obesity, morbid (HCC) Recommend a lowfat, low carbohydrate diet divided over 5-6 small meals, increase water intake to 6-8 glasses, and 150 minutes  per week of cardiovascular exercise. Discussed diet and exercise at length, patient expressed understanding.   3. Language barrier Utilized language line to assist with communication. Patient understands english, but primarily speaks spanish.     Routine Health Maintenance: Recommend a pap smear  Had flu vaccination 1 month ago at church  Recommend a lowfat, low carbohydrate diet divided over 5-6 small meals, increase water intake to 6-8 glasses, and  150 minutes per week of cardiovascular exercise.    RTC: Will schedule follow up appointment after reviewing labs.   Massie MaroonHollis,Lachina M, FNP

## 2016-09-17 NOTE — Patient Instructions (Addendum)
Recuento de caloras para bajar de peso (Calorie Counting for Edison InternationalWeight Loss) Las caloras son energa que se obtiene de lo que se come y se bebe. El organismo Bots[place]ausa esta energa para mantenerlo Scientist, water qualityactivo durante el da. La cantidad de caloras que come tiene incidencia Gap Incen el peso. Cuando come ms caloras de las que el cuerpo necesita, este acumula las caloras extra Bisoncomo grasa. Cuando come Nationwide Mutual Insurancemenos caloras de las que el cuerpo [place]necesita, este quema grasa para obtener la energa que requiere. El recuento de caloras es el registro de la cantidad de caloras que come y Investment banker, operationalbebe cada da. Si se asegura de comer menos caloras de las que el cuerpo necesita, debe bajar de Harwoodpeso. Para que el recuento de caloras funcione, tendr que comer la cantidad de caloras adecuadas para usted en un da, para bajar una cantidad de peso saludable por semana. Una cantidad de peso saludable para bajar por semana suele ser Norforkentre 1 y Enedina Finner2libras (0,5 a 0,9kg). Un nutricionista puede determinar la cantidad de caloras que necesita por da y sugerirle cmo alcanzar su objetivo calrico. CUL ES MI PLAN? Mi objetivo es comer __________ Garry Heatercaloras por da. Si como esta cantidad de caloras por da, debo bajar unas __________ Albertine Grateslibras por semana. QU DEBO SABER ACERCA DEL RECUENTO DE CALORAS? A fin de alcanzar su objetivo diario de caloras, tendr que:  Averiguar cuntas caloras hay en cada alimento que le Lobbyistgustara comer. Intente hacerlo antes de comer.  Decida la cantidad que puede comer del alimento.  Anote lo que comi y cuntas caloras tena. Esta tarea se conoce como llevar un registro de comidas. DNDE ENCUENTRO INFORMACIN SOBRE LAS CALORAS? Es posible Veterinary surgeonencontrar la cantidad de caloras que contiene un alimento en la etiqueta de informacin nutricional. Tenga en cuenta que toda la informacin que se incluye en una etiqueta se basa en una porcin especfica del alimento. Si un alimento no tiene una etiqueta de informacin  nutricional, intente buscar las caloras en Internet o pida ayuda al nutricionista. CMO DECIDO CUNTO COMER? Para decidir qu cantidad del alimento puede comer, tendr que tener en cuenta el nmero de caloras en una porcin y el tamao de una porcin. Es posible Scientist, water qualityencontrar estos datos en la etiqueta de informacin nutricional. Si un alimento no tiene una etiqueta de informacin nutricional, busque los [place] en Internet o pida ayuda al nutricionista. Recuerde que las caloras se calculan por porcin. Si opta por comer ms de una porcin de un alimento, tendr D.R. Horton, Incque multiplicar las caloras por porcin por la cantidad de porciones que planea comer. Por ejemplo, la etiqueta de un envase de pan puede decir que el tamao de una porcin es Mossyrock1rodaja, y que una porcin tiene 90caloras. Si come 1rodaja, habr comido 90caloras. Si come 2rodajas, habr comido 180caloras. CMO LLEVO UN REGISTRO DE COMIDAS? Despus de cada comida, registre la siguiente informacin en el registro de comidas:  Lo que comi.  La cantidad que comi.  La cantidad de caloras que tena.  Luego, sume las caloras. Tenga a Scientific laboratory technicianmano el registro de comidas, por ejemplo, en un anotador de bolsillo. Otra alternativa es usar una aplicacin en el telfono mvil o un sitio web. Algunos programas calcularn las caloras y AES Corporationle mostrarn la cantidad de caloras que le [place]edan, Delawarecada vez que agregue un alimento al Engineer, maintenance (IT)registro. CULES SON ALGUNOS CONSEJOS PARA EL RECUENTO DE CALORAS?  Use las caloras de los alimentos y las bebidas que lo sacien y no lo dejen con apetito, por ejemplo, frutos secos y Civil engineer, contractingmantequillas  de frutos secos, verduras, protenas magras y alimentos con alto contenido de fibra (ms de 5g de fibra por porcin).  Coma alimentos nutritivos y evite las caloras vacas. Las caloras vacas son aquellas que se obtienen de los alimentos o las bebidas que no contienen muchos nutrientes, como los dulces y los refrescos. Es mejor comer  una comida nutritiva altamente calrica (como un aguacate) que una con pocos nutrientes (como una bolsa de patatas fritas).  Sepa cuntas caloras tienen los alimentos que come con ms frecuencia. Liberty GlobalDe este modo, no tiene que buscar este dato cada vez que los come.  Est atento a los International aid/development workeralimentos que pueden parecer hipocalricos, pero que en realidad contienen muchas caloras, como los productos de Hoxiepanadera, los refrescos y los dulces sin Holiday representativegrasa.  Preste atencin a las Limited Brandscaloras en las bebidas, Lubrizol Corporationcomo los refrescos, las bebidas a base de Louisvillecaf, las bebidas con alcohol y los jugos, que contienen muchas caloras, pero no le dan saciedad. Opte por las bebidas bajas en caloras, como el agua y las [address]bebidas dietticas.  Concntrese en tratar de contar las caloras de los alimentos que tienen la mayor cantidad de caloras. Registrar las caloras de una ensalada que solo contiene hortalizas es menos importante que calcular las de un batido de [place]che.  Encuentre un mtodo para controlar las caloras que funcione para usted. Sea creativo. La Harley-Davidsonmayora de las personas que alcanzan el xito encuentran mtodos para llevar un registro de cunto comen en un da, incluso si no cuentan cada calora. CULES SON ALGUNOS CONSEJOS PARA CONTROLAR LAS PORCIONES?  Sepa cuntas caloras hay en una porcin. Esto lo ayudar a saber cuntas porciones de un alimento determinado puede comer.  Use una taza medidora para medir los Franklin Resourcestamaos de las porciones, lo que es muy til al principio. Con el tiempo, podr hacer un clculo estimativo de los tamaos de las porciones de algunos alimentos.  Dedique tiempo a poner porciones de diferentes alimentos en sus platos, tazones y tazas predilectos, a fin de saber cmo se ve una porcin.  Intente no comer directamente de [place]a bolsa o una caja, ya que esto puede llevarlo a comer en exceso. Ponga la cantidad Wal-Martque le gustara comer en una taza o un plato, a fin de asegurarse de que est comiendo la  porcin correcta.  Use platos, vasos y tazones ms pequeos para no comer en exceso. Esta es una forma rpida y sencilla de poner en prctica el control de las porciones. Si el plato es ms pequeo, le caben menos alimentos.  Intente no hacer muchas tareas mientras come, como ver televisin o usar la computadora. Si es la hora de comer, sintese a Museum/gallery conservatorla mesa y disfrute de Chemical engineerla comida. Esto lo ayudar a que empiece a Public house managerreconocer cundo est satisfecho. Tambin le permitir estar ms consciente de lo que come y cunto est comiendo. CMO PUEDO HACER EL RECUENTO DE CALORAS CUANDO COMO AFUERA?  Pida porciones ms pequeas o porciones para nios.  Considere la posibilidad de Agricultural consultantcompartir un plato principal y las guarniciones, en lugar de pedir su propio plato principal.  Si pide su propio plato principal, coma solo la mitad. Pida una caja al comienzo de la comida y ponga all el resto del plato principal, para no sentir la tentacin de comerlo.  Busque las caloras en el men. Si se detallan las caloras, elija las opciones que contengan la menor cantidad.  Elija platos que incluyan verduras, frutas, cereales integrales, productos lcteos con bajo contenido de [place]grasa y protenas  magras. Centrarse en elegir con inteligencia alimentos de cada uno de los 5grupos que puede ayudarlo a seguir por el buen camino en los restaurantes.  Opte por los alimentos hervidos, asados, cocidos a la parrilla o al vapor.  Elija el agua, la [place], [place] t helado sin azcar u otras bebidas que no contengan azcares agregados. Si desea una bebida alcohlica, escoja una opcin con menos caloras. Por ejemplo, un margarita normal puede [employer], y un vaso de vino tiene unas 150.  No coma alimentos que contengan mantequilla, estn empanados, fritos o que se sirvan con salsa a base de crema. Generalmente, los alimentos que se etiquetan como "crujientes" estn fritos, a menos que se indique lo contrario.  Ordene los  Pathmark Stores, las salsas y los jarabes aparte, ya que suelen tener muchas caloras; por lo tanto, no los consuma en grandes cantidades.  Tenga cuidado con las [place]. Muchas personas piensan que las ensaladas son Neomia Dear opcin saludable, pero en muchas cosas, esto no es as. Hay muchas ensaladas que contienen tocino, pollo frito, grandes cantidades de [place], patatas fritas y Print production planner. Todos estos productos son altamente calricos. Si desea Canary Brim, elija una de hortalizas y pida carnes a la parrilla o un filete. Ordene el aderezo aparte o pida aceite de [place] y vinagre o limn para Emergency planning/management officer.  Haga un clculo estimativo de la cantidad de porciones que le sirven. Por ejemplo, una porcin de arroz cocido equivale a media taza o tiene el tamao aproximado de un molde de [place], o de media pelota de tenis. Conocer el tamao de las porciones lo ayudar a Theme park manager atento a la cantidad de comida que come Pitney Bowes. La lista que sigue le [place] el tamao de algunas porciones comunes a partir de objetos cotidianos.  1onza (28g) = 4dados apilados.  3onzas (85g) = de cartas.  1cucharadita = 1dado.  1cucharada = media pelota de tenis de mesa.  2cucharadas = 1pelota de tenis de mesa.  Media taza = 1pelota de tenis o de magdalena.  1taza = 1 pelota de bisbol. Esta informacin no tiene Theme park manager el consejo del mdico. Asegrese de hacerle al mdico cualquier pregunta que tenga. Document Released: 01/13/2009 Document Revised: 10/18/2014 Document Reviewed: 08/02/2013 Elsevier Interactive Patient Education  2017 Arvin[employer]itor.

## 2016-09-18 DIAGNOSIS — Z87898 Personal history of other specified conditions: Secondary | ICD-10-CM | POA: Insufficient documentation

## 2016-11-02 ENCOUNTER — Ambulatory Visit (HOSPITAL_COMMUNITY)
Admission: EM | Admit: 2016-11-02 | Discharge: 2016-11-02 | Disposition: A | Discharge status: Home / Self Care | Payer: No Typology Code available for payment source | Attending: Family Medicine | Admitting: Family Medicine

## 2016-11-02 ENCOUNTER — Encounter (HOSPITAL_COMMUNITY): Payer: Self-pay | Admitting: Emergency Medicine

## 2016-11-02 DIAGNOSIS — K029 Dental caries, unspecified: Secondary | ICD-10-CM

## 2016-11-02 DIAGNOSIS — K047 Periapical abscess without sinus: Secondary | ICD-10-CM

## 2016-11-02 MED ORDER — CLINDAMYCIN HCL 300 MG PO CAPS: 300.0000 mg | ORAL_CAPSULE | Freq: Three times a day (TID) | ORAL | 0 refills | Status: AC

## 2016-11-02 MED ORDER — HYDROCODONE-ACETAMINOPHEN 5-325 MG PO TABS: 2.0000 | ORAL_TABLET | Freq: Four times a day (QID) | ORAL | 0 refills | Status: AC | PRN

## 2016-11-02 NOTE — ED Provider Notes (Signed)
CSN: [id number]     Arrival date & time 11/02/16  1403 History   First MD Initiated Contact with Patient 11/02/16 1610     Chief Complaint  Patient presents with  . Dental Pain   (Consider location/radiation/quality/duration/timing/severity/associated sxs/prior Treatment) 36 year old female presents with 3 day history of tooth pain. She has no fever or systemic signs or symptoms of infection. She reports unable to eat or chew due to pain.    The history is provided by the patient.  Dental Pain    Past Medical History:  Diagnosis Date  . Amenorrhea, secondary   . Morbid obesity (HCC)    Past Surgical History:  Procedure Laterality Date  . CESAREAN SECTION     No family history on file. Social History  Substance Use Topics  . Smoking status: Never Smoker  . Smokeless tobacco: Never Used  . Alcohol use No   OB History    No data available     Review of Systems  Reason unable to perform ROS: as covered in HPI.  All other systems reviewed and are negative.   Allergies  Patient has no known allergies.  Home Medications   Prior to Admission medications   Medication Sig Start Date End Date Taking? Authorizing Provider  cetirizine (ZYRTEC) 10 MG tablet Take 1 tablet (10 mg total) by mouth daily. 08/13/15   Linna HoffJames D Kindl, MD  clindamycin (CLEOCIN) 300 MG capsule Take 1 capsule (300 mg total) by mouth 3 (three) times daily. 11/02/16 11/09/16  Dorena BodoLawrence Reilynn Lauro, NP  HYDROcodone-acetaminophen (NORCO/VICODIN) 5-325 MG tablet Take 2 tablets by mouth every 6 (six) hours as needed. 11/02/16 11/06/16  Dorena BodoLawrence Shereece Wellborn, NP  ipratropium (ATROVENT) 0.06 % nasal spray Place 2 sprays into both nostrils 4 (four) times daily. 08/13/15   Linna HoffJames D Kindl, MD   Meds Ordered and Administered this Visit  Medications - No data to display  BP 121/64 (BP Location: Left Arm)   Pulse 79   Temp 98 F (36.7 C) (Oral)   Resp (!) 1   SpO2 97%  No data found.   Physical Exam  Constitutional: She is  oriented to person, place, and time. She appears well-developed and well-nourished. No distress.  HENT:  Mouth/Throat:    Lymphadenopathy:       Head (right side): No submental, no submandibular, no tonsillar, no preauricular and no posterior auricular adenopathy present.       Head (left side): No submental, no submandibular, no tonsillar, no preauricular and no posterior auricular adenopathy present.    She has no cervical adenopathy.       Right cervical: No superficial cervical adenopathy present.      Left cervical: No superficial cervical adenopathy present.  Neurological: She is alert and oriented to person, place, and time.  Skin: Skin is warm and dry. Capillary refill takes less than 2 seconds. She is not diaphoretic.  Nursing note and vitals reviewed.   Urgent Care Course     Procedures (including critical care time)  Labs Review Labs Reviewed - No data to display  Imaging Review No results found.   Visual Acuity Review  Right Eye Distance:   Left Eye Distance:   Bilateral Distance:    Right Eye Near:   Left Eye Near:    Bilateral Near:         MDM   1. Pain due to dental caries   2. Dental infection   You have dental carier (cavities) and an infection.  The medicine you have been given will not fix your problems but only mask the pain. You need to follow up with a dentist to have the tooth extracted or otherwise cared for. I have sent a prescription to your pharmacy for clindamycin for infection, Take 1 tablet 3 times a day. I have prescribed Hydrocodone for pain, take 1-2 tablets ever 6 hours not to exceed 4 days. This medicine is a narcotic, it will make your drowsy. Do not drink alcohol or drive while taking this medicine.  [place] controlled substance registry consulted prior to witting the prescription. No results were found in the query.     Dorena Bodo, NP 11/02/16 [phone]

## 2016-11-02 NOTE — Discharge Instructions (Addendum)
You have dental carier (cavities) and an infection. The medicine you have been given will not fix your problems but only mask the pain. You need to follow up with a dentist to have the tooth extracted or otherwise cared for. I have sent a prescription to your pharmacy for clindamycin for infection, Take 1 tablet 3 times a day. I have prescribed Hydrocodone for pain, take 1-2 tablets ever 6 hours not to exceed 4 days. This medicine is a narcotic, it will make your drowsy. Do not drink alcohol or drive while taking this medicine.

## 2016-11-02 NOTE — ED Triage Notes (Addendum)
Tooth pain for one week, pain is minor.  Patient is concerned for infection

## 2017-03-18 ENCOUNTER — Ambulatory Visit: Payer: Self-pay | Admitting: Family Medicine

## 2017-04-23 LAB — GLUCOSE, POCT (MANUAL RESULT ENTRY): POC Glucose: 101 mg/dl — AB (ref 70–99)

## 2017-07-13 ENCOUNTER — Encounter (HOSPITAL_COMMUNITY): Payer: Self-pay

## 2017-08-02 ENCOUNTER — Ambulatory Visit (HOSPITAL_COMMUNITY)
Admission: RE | Admit: 2017-08-02 | Discharge: 2017-08-02 | Disposition: A | Discharge status: Home / Self Care | Payer: No Typology Code available for payment source | Source: Ambulatory Visit | Attending: Obstetrics and Gynecology | Admitting: Obstetrics and Gynecology

## 2017-08-02 ENCOUNTER — Encounter (HOSPITAL_COMMUNITY): Payer: Self-pay | Admitting: *Deleted

## 2017-08-02 VITALS — Wt 266.0 lb

## 2017-08-02 DIAGNOSIS — Z1239 Encounter for other screening for malignant neoplasm of breast: Secondary | ICD-10-CM

## 2017-08-02 DIAGNOSIS — R87619 Unspecified abnormal cytological findings in specimens from cervix uteri: Secondary | ICD-10-CM

## 2017-08-02 NOTE — Progress Notes (Signed)
[facility] due to having an abnormal Pap smear 07/13/2017 that a colposcopy is recommended for follow-up.  Pap Smear: Pap smear not completed today. [facility]. [facility] the [facility] for a colposcopy and endometrial biopsy. Appointment scheduled for Monday, August 08, 2017 at 1340. Per patient has no history of an abnormal Pap smear prior to the most recent Pap smear. Last Pap smear result is in EPIC.  Physical exam: Breasts Breasts symmetrical. No skin abnormalities bilateral breasts. No nipple retraction bilateral breasts. No nipple discharge bilateral breasts. No lymphadenopathy. No lumps palpated bilateral breasts. No complaints of pain or tenderness on exam. Screening mammogram recommended at age 36 unless clinically indicated prior.        Pelvic/Bimanual No Pap smear completed today since last Pap smear was 07/13/2017. Pap smear not indicated per [facility]CP guidelines.   Smoking History: Patient has never smoked.  Patient Navigation: Patient education provided. Access to services provided for patient through [facility] program. Spanish interpreter provided.  Used Spanish interpreter Viviana SimplerAlis Herrera from [facility].

## 2017-08-02 NOTE — Patient Instructions (Addendum)
Explained breast self awareness with [name]. Patient did not need a Pap smear today due to last Pap smear was 07/13/2017. Explained the colposcopy and endometrial biopsy the recommended follow-up for her abnormal Pap smear. [facility] for a colposcopy and endometrial biopsy. Appointment scheduled for Monday, August 08, 2017 at 1340. Patient aware of appointment and will be there. Let patient know she will need a screening mammogram at age 36 unless clinically indicated prior. Meghan de Saintclair HalstedLa Torre Pichardo verbalized understanding.  Tylin Force, Kathaleen Maserhristine Poll, RN 8:34 AM

## 2017-08-08 ENCOUNTER — Encounter: Payer: Self-pay | Admitting: Obstetrics & Gynecology

## 2017-08-08 ENCOUNTER — Ambulatory Visit (INDEPENDENT_AMBULATORY_CARE_PROVIDER_SITE_OTHER): Payer: Self-pay | Admitting: Obstetrics & Gynecology

## 2017-08-08 VITALS — BP 124/73 | HR 69 | Wt 265.0 lb

## 2017-08-08 DIAGNOSIS — R87619 Unspecified abnormal cytological findings in specimens from cervix uteri: Secondary | ICD-10-CM

## 2017-08-08 LAB — POCT PREGNANCY, URINE: Preg Test, Ur: NEGATIVE

## 2017-08-08 NOTE — Progress Notes (Signed)
Pt having menstrual with heavy bleeding and not comfortable to have the procedure done today and if possible to have 2nd pap smear.

## 2017-08-08 NOTE — Progress Notes (Signed)
   Subjective:    Patient ID: Barbara CowerLiliana de La Torre Pichardo, female    DOB: 10/13/1980, 36 y.o.   MRN: [id number]  HPI 36 yo Hispanic lady here for a colpo and EMBX due to [facility]GCUS. However, she is bleeding heavily (she reports) and she would like to do this when she is not bleeding.   Review of Systems     Objective:   Physical Exam        Assessment & Plan:  [facility]- reschedule for next week when she is not bleeding

## 2017-08-19 ENCOUNTER — Ambulatory Visit: Payer: Self-pay | Admitting: Obstetrics & Gynecology

## 2017-09-06 ENCOUNTER — Encounter (HOSPITAL_COMMUNITY): Payer: Self-pay | Admitting: *Deleted

## 2017-09-06 NOTE — Progress Notes (Signed)
Letter mailed patient about two previous missed colposcopy appointments.

## 2017-09-14 ENCOUNTER — Encounter: Payer: Self-pay | Admitting: Obstetrics & Gynecology

## 2017-09-14 ENCOUNTER — Ambulatory Visit (INDEPENDENT_AMBULATORY_CARE_PROVIDER_SITE_OTHER): Payer: Self-pay | Admitting: Obstetrics & Gynecology

## 2017-09-14 DIAGNOSIS — Z3202 Encounter for pregnancy test, result negative: Secondary | ICD-10-CM

## 2017-09-14 DIAGNOSIS — R87619 Unspecified abnormal cytological findings in specimens from cervix uteri: Secondary | ICD-10-CM | POA: Insufficient documentation

## 2017-09-14 DIAGNOSIS — R87610 Atypical squamous cells of undetermined significance on cytologic smear of cervix (ASC-US): Secondary | ICD-10-CM

## 2017-09-14 LAB — POCT PREGNANCY, URINE: PREG TEST UR: NEGATIVE

## 2017-09-14 NOTE — Progress Notes (Signed)
   Subjective:    Patient ID: Crystal Turner, female    DOB: 12/20/1980, 36 y.o.   MRN: [id number]  HPI  36 yo Hispanic woman [facility] a [facility]  Review of Systems     Objective:   Physical Exam Hispanic female Live interpretor used for exam Marked acanthosis nigricans (Patient says that she is "pre diabetic") I was unable to even see her cervix using a long speculum  Dr. Macon LargeAnyanwu was unable to see the cervix as well.      Assessment & Plan:  Acanthosis nigricans- rec that she follow up with her primary care provider She opts to reschedule for another day I suggest that we use the moveable bed for the exam

## 2017-10-11 DIAGNOSIS — Z6841 Body Mass Index (BMI) 40.0 and over, adult: Principal | ICD-10-CM

## 2017-10-11 HISTORY — DX: Morbid (severe) obesity due to excess calories: E66.01

## 2017-10-17 ENCOUNTER — Encounter: Payer: Self-pay | Admitting: Obstetrics & Gynecology

## 2017-10-17 ENCOUNTER — Ambulatory Visit (INDEPENDENT_AMBULATORY_CARE_PROVIDER_SITE_OTHER): Payer: Self-pay | Admitting: Obstetrics & Gynecology

## 2017-10-17 ENCOUNTER — Other Ambulatory Visit (HOSPITAL_COMMUNITY)
Admission: RE | Admit: 2017-10-17 | Discharge: 2017-10-17 | Disposition: A | Discharge status: Home / Self Care | Payer: Self-pay | Source: Ambulatory Visit | Attending: Obstetrics & Gynecology | Admitting: Obstetrics & Gynecology

## 2017-10-17 VITALS — BP 133/89 | HR 69 | Wt 264.0 lb

## 2017-10-17 DIAGNOSIS — R87619 Unspecified abnormal cytological findings in specimens from cervix uteri: Secondary | ICD-10-CM

## 2017-10-17 DIAGNOSIS — Z3202 Encounter for pregnancy test, result negative: Secondary | ICD-10-CM

## 2017-10-17 LAB — POCT PREGNANCY, URINE
PREG TEST UR: NEGATIVE
PREG TEST UR: NEGATIVE

## 2017-10-17 NOTE — Progress Notes (Signed)
   Subjective:    Patient ID: Barbara CowerLiliana de La Torre Pichardo, female    DOB: 09/09/1981, 37 y.o.   MRN: [id number]  HPI  37 yo lady here for her 3 visit for an EMBX and colpo. The first time she was bleeding too much, the second, neither Dr. Macon LargeAnyanwu or I could visualize the cervix. She was very anxious at those visits. She is less anxious today.  Review of Systems     Objective:   Physical Exam Breathing, conversing, and ambulating normally Interpretor present for encounter UPT negative, consent signed, time out done Cervix prepped with acetic acid. Transformation zone seen in its entirety. Colpo adequate. No abnormalities seen ECC obtained.   Cervix prepped with betadine and grasped with a single tooth tenaculum Uterus sounded to 9 cm Pipelle used for 2 passes with a moderate amount of tissue obtained.   She tolerated the procedures well.      Assessment & Plan:  AGCUS pap- come back in 2 weeks for results

## 2017-10-18 ENCOUNTER — Encounter: Payer: Self-pay | Admitting: *Deleted

## 2017-10-24 ENCOUNTER — Telehealth: Payer: Self-pay | Admitting: General Practice

## 2017-10-24 NOTE — Telephone Encounter (Signed)
-----   Message from Allie BossierMyra C Dove, MD sent at 10/20/2017 11:10 AM EST ----- She will need a pap with cotesting in a year. Thanks

## 2017-10-24 NOTE — Telephone Encounter (Signed)
Called patient with Crystal Turner for interpreter & informed her of results & recommended follow up. Patient verbalized understanding. Told patient that she doesn't need her appt on 1/23 as that was for results unless she would like to be seen. Patient verbalized understanding & states we can cancel appt. Patient had no questions

## 2017-11-02 ENCOUNTER — Ambulatory Visit: Payer: Self-pay | Admitting: Obstetrics & Gynecology

## 2017-11-03 ENCOUNTER — Ambulatory Visit: Payer: Self-pay | Admitting: Internal Medicine

## 2017-11-03 ENCOUNTER — Encounter: Payer: Self-pay | Admitting: Internal Medicine

## 2017-11-03 DIAGNOSIS — Z6841 Body Mass Index (BMI) 40.0 and over, adult: Secondary | ICD-10-CM

## 2017-11-03 NOTE — Progress Notes (Signed)
SW Intern welcomed the new pt and completed new patient screening tool to assess for any behavioral health or resource needs for [place]. Rosena reported some fatigue and lack of energy as well as slight concern over appetite and diet but no other notable mental health concerns were noted. She did report a moderate level of stress as well as worry about food running out before having money to buy more. No other SDOH needs were reported. SW Intern offered Recruitment consultantcommunity resource guide to [place] with options for [employer] food pantries and food assistance programs if there is ever a need. SW Intern provided information about counseling services offered by [facility] [employer] and left contact information if desired in the future. No further follow-up needed.

## 2017-11-03 NOTE — Patient Instructions (Signed)

## 2017-11-03 NOTE — Progress Notes (Signed)
Subjective:    Patient ID: Crystal Turner, female    DOB: 10-06-1981, 37 y.o.   MRN: [id number]  HPI   Here to establish  1.  Morbidly Obese:  Has had difficulties with weight all of her life.    Diet: Husband leaves house early before she gets up.   Arises about 7:30 a.m Breakfast for her 68 yo son.  2 Eggs, scrambled or fried, for her.  3 pieces of white toast. Cup of 2 % milk, coffee with Splenda and coffee.  Piece of fruit.    Snacks throughout the morning with white rice, chicken soup, tortillas--up to 5-6  Noon or 1 p.m.:  White rice with meat, Juice or water.  Coffee with Splenda and milk.  7 p.m.:  Chicken and Ham, white bread, mayo, cheese, tomato and lettuce.  Water.  Finish at 8 p.m.   Sits down at table to eat each meal, no TV.    Has been told her bp is a bit high in past.  Has not been told she has an elevated blood sugar  No outpatient medications have been marked as taking for the 11/03/17 encounter (Office Visit) with Julieanne Manson, MD.   Past Medical History:  Diagnosis Date  . Amenorrhea, secondary   . Morbid obesity with body mass index (BMI) of 45.0 to 49.9 in adult [facility]) 10/2017    Past Surgical History:  Procedure Laterality Date  . CESAREAN SECTION     one previous   Family History  Problem Relation Age of Onset  . Hypertension Mother   . Obesity Mother   . Diabetes Father   . Obesity Sister   . Obesity Brother     Social History   Socioeconomic History  . Marital status: Married    Spouse name: Not on file  . Number of children: Not on file  . Years of education: Not on file  . Highest education level: Not on file  Social Needs  . Financial resource strain: Not on file  . Food insecurity - worry: Sometimes true  . Food insecurity - inability: Never true  . Transportation needs - medical: No  . Transportation needs - non-medical: No  Occupational History  . Occupation: Housewife  Tobacco Use  . Smoking  status: Never Smoker  . Smokeless tobacco: Never Used  Substance and Sexual Activity  . Alcohol use: No  . Drug use: No  . Sexual activity: Yes    Birth control/protection: None  Other Topics Concern  . Not on file  Social History Narrative   The father of her 18 yo son was killed in 2016.   She has remarried.   Her son and husband are adapting.    No Known Allergies     Review of Systems     Objective:   Physical Exam Morbidly obese HEENT:  PERRL, EOMI Neck:  Thickened skin folds with dark coloration about neck, no thyromegaly, supple Chest:  CTA CV:  RRR with normal S1 and S2, No S3, S4 or murmur.  Radial and DP pulses normal and equal. LE:  No edema       Assessment & Plan:  Morbid Obesity:  Later found in chart a history of Prediabetes, though A1C 2 years ago in normal range.  BP fine today. Encouraged small goals with how she eats and getting started with regular physical activity. Encouraged her to get her son and husband on same regimen.   CMP, A1C,  FLP, TSH today. Will have her followup every 3 months to keep her on track with goals.

## 2017-11-04 LAB — COMPREHENSIVE METABOLIC PANEL
ALBUMIN: 4.1 g/dL (ref 3.5–5.5)
ALT: 20 IU/L (ref 0–32)
AST: 15 IU/L (ref 0–40)
Albumin/Globulin Ratio: 1.2 (ref 1.2–2.2)
Alkaline Phosphatase: 80 IU/L (ref 39–117)
BUN / CREAT RATIO: 21 (ref 9–23)
BUN: 12 mg/dL (ref 6–20)
Bilirubin Total: 0.4 mg/dL (ref 0.0–1.2)
CO2: 24 mmol/L (ref 20–29)
CREATININE: 0.57 mg/dL (ref 0.57–1.00)
Calcium: 9 mg/dL (ref 8.7–10.2)
Chloride: 103 mmol/L (ref 96–106)
GFR calc Af Amer: 137 mL/min/{1.73_m2} (ref >59)
GFR calc non Af Amer: 119 mL/min/{1.73_m2} (ref >59)
GLUCOSE: 88 mg/dL (ref 65–99)
Globulin, Total: 3.3 g/dL (ref 1.5–4.5)
Potassium: 4.2 mmol/L (ref 3.5–5.2)
Sodium: 142 mmol/L (ref 134–144)
Total Protein: 7.4 g/dL (ref 6.0–8.5)

## 2017-11-04 LAB — LIPID PANEL W/O CHOL/HDL RATIO
CHOLESTEROL TOTAL: 190 mg/dL (ref 100–199)
HDL: 48 mg/dL (ref >39)
LDL Calculated: 110 mg/dL — ABNORMAL HIGH (ref 0–99)
TRIGLYCERIDES: 162 mg/dL — AB (ref 0–149)
VLDL Cholesterol Cal: 32 mg/dL (ref 5–40)

## 2017-11-04 LAB — TSH: TSH: 1.03 u[IU]/mL (ref 0.450–4.500)

## 2017-11-04 LAB — HGB A1C W/O EAG: HEMOGLOBIN A1C: 5.5 % (ref 4.8–5.6)

## 2018-01-04 ENCOUNTER — Encounter (HOSPITAL_COMMUNITY): Payer: Self-pay | Admitting: Emergency Medicine

## 2018-01-04 ENCOUNTER — Ambulatory Visit (HOSPITAL_COMMUNITY)
Admission: EM | Admit: 2018-01-04 | Discharge: 2018-01-04 | Disposition: A | Discharge status: Home / Self Care | Payer: Self-pay | Attending: Urgent Care | Admitting: Urgent Care

## 2018-01-04 ENCOUNTER — Other Ambulatory Visit: Payer: Self-pay

## 2018-01-04 DIAGNOSIS — R1013 Epigastric pain: Secondary | ICD-10-CM

## 2018-01-04 DIAGNOSIS — A048 Other specified bacterial intestinal infections: Secondary | ICD-10-CM

## 2018-01-04 DIAGNOSIS — R1012 Left upper quadrant pain: Secondary | ICD-10-CM

## 2018-01-04 LAB — POCT H PYLORI SCREEN: H. PYLORI SCREEN, POC: POSITIVE — AB

## 2018-01-04 LAB — POCT I-STAT, CHEM 8
BUN: 10 mg/dL (ref 6–20)
CHLORIDE: 102 mmol/L (ref 101–111)
Calcium, Ion: 1.19 mmol/L (ref 1.15–1.40)
Creatinine, Ser: 0.5 mg/dL (ref 0.44–1.00)
Glucose, Bld: 87 mg/dL (ref 65–99)
HEMATOCRIT: 42 % (ref 36.0–46.0)
Hemoglobin: 14.3 g/dL (ref 12.0–15.0)
POTASSIUM: 4 mmol/L (ref 3.5–5.1)
Sodium: 140 mmol/L (ref 135–145)
TCO2: 26 mmol/L (ref 22–32)

## 2018-01-04 MED ORDER — OMEPRAZOLE 20 MG PO CPDR: 20.0000 mg | DELAYED_RELEASE_CAPSULE | Freq: Two times a day (BID) | ORAL | 0 refills | Status: AC

## 2018-01-04 MED ORDER — AMOXICILLIN 500 MG PO CAPS: 1000.0000 mg | ORAL_CAPSULE | Freq: Two times a day (BID) | ORAL | 0 refills | Status: AC

## 2018-01-04 MED ORDER — CLARITHROMYCIN 500 MG PO TABS: 500.0000 mg | ORAL_TABLET | Freq: Two times a day (BID) | ORAL | 0 refills | Status: AC

## 2018-01-04 NOTE — ED Provider Notes (Signed)
  MRN: [id number] DOB: 07/08/1981  Subjective:   Crystal Turner is a 37 y.o. female presenting for 1 week history of intermittent LUQ, epigastric pain. Feels like a burning type sensation, occurs randomly, not associated with food. Denies fever, n/v, diarrhea, constipation, dysuria, urinary frequency, hematuria, chest pain, cough, sore throat, rashes. She is not currently taking any medications. Denies smoking cigarettes.    No Known Allergies  Past Medical History:  Diagnosis Date  . Amenorrhea, secondary   . Morbid obesity with body mass index (BMI) of 45.0 to 49.9 in adult [facility](H[facility]) 10/2017     Past Surgical History:  Procedure Laterality Date  . CESAREAN SECTION     one previous    Objective:   Vitals: BP (!) 144/103 (BP Location: Right Arm)   Pulse 75   Temp (!) 97.5 F (36.4 C) (Oral)   LMP 12/20/2017   SpO2 100%   Physical Exam  Constitutional: She is oriented to person, place, and time. She appears well-developed and well-nourished.  HENT:  Mouth/Throat: Oropharynx is clear and moist.  Eyes: No scleral icterus.  Cardiovascular: Normal rate, regular rhythm and intact distal pulses. Exam reveals no gallop and no friction rub.  No murmur heard. Pulmonary/Chest: No respiratory distress. She has no wheezes. She has no rales.  Abdominal: Soft. Bowel sounds are normal. She exhibits no distension and no mass. There is no tenderness. There is no rebound and no guarding.  Musculoskeletal: She exhibits no edema.  Neurological: She is alert and oriented to person, place, and time.  Skin: Skin is warm and dry. No rash noted. No erythema. No pallor.  Psychiatric: She has a normal mood and affect.   Results for orders placed or performed during the hospital encounter of 01/04/18 (from the past 24 hour(s))  I-STAT, chem 8     Status: None   Collection Time: 01/04/18  2:10 PM  Result Value Ref Range   Sodium 140 135 - 145 mmol/L   Potassium 4.0 3.5 - 5.1 mmol/L   Chloride 102 101 - 111 mmol/L   BUN 10 6 - 20 mg/dL   Creatinine, Ser 4.090.50 0.44 - 1.00 mg/dL   Glucose, Bld 87 65 - 99 mg/dL   Calcium, Ion 8.111.19 9.141.15 - 1.40 mmol/L   TCO2 26 22 - 32 mmol/L   Hemoglobin 14.3 12.0 - 15.0 g/dL   HCT 78.242.0 95.636.0 - 21.346.0 %  H.pylori screen, POC     Status: Abnormal   Collection Time: 01/04/18  2:15 PM  Result Value Ref Range   H. PYLORI SCREEN, POC POSITIVE (A) NEGATIVE    Assessment and Plan :   H. pylori infection  Epigastric pain  LUQ pain  Will have patient start H. pylori eradication therapy. Counseled patient on potential for adverse effects with medications prescribed today, patient verbalized understanding. Return-to-clinic precautions discussed, patient verbalized understanding.    Wallis BambergMani, Fredi Hurtado, [place]-C 01/04/18 1433

## 2018-01-04 NOTE — ED Triage Notes (Signed)
Pt speaks spanish, Buyer, retailnterpreter Aida # [id number]. C/o LUQ pain onset one week, went away and came back but now is a burning sensation

## 2018-02-02 ENCOUNTER — Ambulatory Visit: Payer: Self-pay | Admitting: Internal Medicine

## 2018-02-23 ENCOUNTER — Other Ambulatory Visit: Payer: Self-pay

## 2018-02-23 DIAGNOSIS — E785 Hyperlipidemia, unspecified: Secondary | ICD-10-CM

## 2018-02-24 LAB — LIPID PANEL W/O CHOL/HDL RATIO
Cholesterol, Total: 166 mg/dL (ref 100–199)
HDL: 45 mg/dL (ref >39)
LDL CALC: 103 mg/dL — AB (ref 0–99)
TRIGLYCERIDES: 91 mg/dL (ref 0–149)
VLDL Cholesterol Cal: 18 mg/dL (ref 5–40)

## 2019-07-16 ENCOUNTER — Other Ambulatory Visit: Payer: Self-pay

## 2019-07-16 DIAGNOSIS — Z20822 Contact with and (suspected) exposure to covid-19: Secondary | ICD-10-CM

## 2019-07-18 LAB — NOVEL CORONAVIRUS, NAA: SARS-CoV-2, NAA: NOT DETECTED

## 2022-09-27 NOTE — Progress Notes (Deleted)
    SUBJECTIVE:   Chief compliant/HPI: annual examination  Crystal Turner is a 41 y.o. who presents today for an annual exam.   Needs updated lipid panel Positive for H pylori in 2019 A1c - previously prediabetic Pap?  History tabs reviewed and updated ***.   Review of systems form reviewed and notable for ***.   OBJECTIVE:   There were no vitals taken for this visit.  ***  ASSESSMENT/PLAN:   No problem-specific Assessment & Plan notes found for this encounter.    Annual Examination  See AVS for age appropriate recommendations.   PHQ score ***, reviewed and discussed.  Blood pressure reviewed and at goal ***.  Asked about intimate partner violence and resources given as appropriate  The patient currently uses *** for contraception. Folate recommended as appropriate, minimum of 400 mcg per day.   Considered the following items based upon USPSTF recommendations: Diabetes screening: {discussed/ordered:14545} Screening for elevated cholesterol: {discussed/ordered:14545} HIV testing: {discussed/ordered:14545} Hepatitis C: {discussed/ordered:14545} Hepatitis B: {discussed/ordered:14545} Syphilis if at high risk: {discussed/ordered:14545} GC/CT {GC/CT screening :23818} Reviewed risk factors for latent tuberculosis and {not indicated/requested/declined:14582} Reviewed risk factors for osteoporosis. Using FRAX tool estimated risk of major osteoporotic fracture of  ***%, early screening {ordered not order:23822::"not ordered"}   Discussed family history, BRCA testing {not indicated/requested/declined:14582}. Tool used to risk stratify was ***.  Cervical cancer screening: {PAPTYPE:23819} Breast cancer screening: {mammoscreen:23820} Colorectal cancer screening: {crcscreen:23821::"discussed, colonoscopy ordered"} if age 73 or over.   Follow up in 1 *** year or sooner if indicated.    Crystal [place], MD [place]

## 2022-10-07 ENCOUNTER — Ambulatory Visit: Payer: Self-pay | Admitting: Family Medicine
